# Patient Record
Sex: Female | Born: 1945
Health system: Southern US, Community
[De-identification: ages and names within clinical notes are randomized; demographics above are authoritative.]

## PROBLEM LIST (undated history)

## (undated) DIAGNOSIS — M1990 Unspecified osteoarthritis, unspecified site: Secondary | ICD-10-CM

## (undated) DIAGNOSIS — G4733 Obstructive sleep apnea (adult) (pediatric): Secondary | ICD-10-CM

## (undated) DIAGNOSIS — E559 Vitamin D deficiency, unspecified: Secondary | ICD-10-CM

## (undated) DIAGNOSIS — E7800 Pure hypercholesterolemia, unspecified: Secondary | ICD-10-CM

## (undated) DIAGNOSIS — N289 Disorder of kidney and ureter, unspecified: Secondary | ICD-10-CM

## (undated) DIAGNOSIS — N183 Chronic kidney disease, stage 3 unspecified: Secondary | ICD-10-CM

## (undated) DIAGNOSIS — M549 Dorsalgia, unspecified: Secondary | ICD-10-CM

## (undated) DIAGNOSIS — M2550 Pain in unspecified joint: Secondary | ICD-10-CM

## (undated) DIAGNOSIS — E119 Type 2 diabetes mellitus without complications: Secondary | ICD-10-CM

## (undated) DIAGNOSIS — J302 Other seasonal allergic rhinitis: Secondary | ICD-10-CM

## (undated) DIAGNOSIS — R600 Localized edema: Secondary | ICD-10-CM

## (undated) DIAGNOSIS — R7303 Prediabetes: Secondary | ICD-10-CM

## (undated) HISTORY — PX: BUNIONECTOMY: SHX129

## (undated) HISTORY — DX: Vitamin D deficiency, unspecified: E55.9

## (undated) HISTORY — DX: Disorder of kidney and ureter, unspecified: N28.9

## (undated) HISTORY — DX: Other seasonal allergic rhinitis: J30.2

## (undated) HISTORY — DX: Pain in unspecified joint: M25.50

## (undated) HISTORY — DX: Dorsalgia, unspecified: M54.9

## (undated) HISTORY — DX: Prediabetes: R73.03

## (undated) HISTORY — DX: Pure hypercholesterolemia, unspecified: E78.00

## (undated) HISTORY — PX: APPENDECTOMY: SHX54

## (undated) HISTORY — DX: Unspecified osteoarthritis, unspecified site: M19.90

## (undated) HISTORY — DX: Type 2 diabetes mellitus without complications: E11.9

## (undated) HISTORY — DX: Obstructive sleep apnea (adult) (pediatric): G47.33

## (undated) HISTORY — DX: Morbid (severe) obesity due to excess calories: E66.01

## (undated) HISTORY — DX: Chronic kidney disease, stage 3 unspecified: N18.30

## (undated) HISTORY — DX: Localized edema: R60.0

---

## 1999-07-05 ENCOUNTER — Ambulatory Visit (HOSPITAL_BASED_OUTPATIENT_CLINIC_OR_DEPARTMENT_OTHER): Admission: RE | Admit: 1999-07-05 | Discharge: 1999-07-05 | Payer: Self-pay | Admitting: Orthopedic Surgery

## 1999-08-09 ENCOUNTER — Encounter: Payer: Self-pay | Admitting: *Deleted

## 1999-08-09 ENCOUNTER — Encounter: Admission: RE | Admit: 1999-08-09 | Discharge: 1999-08-09 | Payer: Self-pay | Admitting: *Deleted

## 2000-09-23 ENCOUNTER — Encounter: Payer: Self-pay | Admitting: *Deleted

## 2000-09-23 ENCOUNTER — Encounter: Admission: RE | Admit: 2000-09-23 | Discharge: 2000-09-23 | Payer: Self-pay | Admitting: *Deleted

## 2001-01-26 ENCOUNTER — Other Ambulatory Visit: Admission: RE | Admit: 2001-01-26 | Discharge: 2001-01-26 | Payer: Self-pay | Admitting: *Deleted

## 2001-03-08 ENCOUNTER — Ambulatory Visit (HOSPITAL_COMMUNITY): Admission: RE | Admit: 2001-03-08 | Discharge: 2001-03-08 | Payer: Self-pay | Admitting: Gastroenterology

## 2001-11-11 ENCOUNTER — Encounter: Admission: RE | Admit: 2001-11-11 | Discharge: 2001-11-11 | Payer: Self-pay | Admitting: Family Medicine

## 2001-11-11 ENCOUNTER — Encounter: Payer: Self-pay | Admitting: Family Medicine

## 2002-04-11 ENCOUNTER — Encounter: Payer: Self-pay | Admitting: Family Medicine

## 2002-04-11 ENCOUNTER — Ambulatory Visit (HOSPITAL_COMMUNITY): Admission: RE | Admit: 2002-04-11 | Discharge: 2002-04-11 | Payer: Self-pay | Admitting: Family Medicine

## 2002-10-10 ENCOUNTER — Encounter: Payer: Self-pay | Admitting: Family Medicine

## 2002-10-10 ENCOUNTER — Ambulatory Visit (HOSPITAL_COMMUNITY): Admission: RE | Admit: 2002-10-10 | Discharge: 2002-10-10 | Payer: Self-pay | Admitting: Family Medicine

## 2003-07-25 ENCOUNTER — Encounter: Admission: RE | Admit: 2003-07-25 | Discharge: 2003-07-25 | Payer: Self-pay | Admitting: Family Medicine

## 2004-06-17 ENCOUNTER — Other Ambulatory Visit: Admission: RE | Admit: 2004-06-17 | Discharge: 2004-06-17 | Payer: Self-pay | Admitting: Family Medicine

## 2004-10-29 ENCOUNTER — Encounter: Admission: RE | Admit: 2004-10-29 | Discharge: 2004-10-29 | Payer: Self-pay | Admitting: Family Medicine

## 2005-12-31 ENCOUNTER — Encounter: Admission: RE | Admit: 2005-12-31 | Discharge: 2005-12-31 | Payer: Self-pay | Admitting: Family Medicine

## 2007-02-11 ENCOUNTER — Encounter: Admission: RE | Admit: 2007-02-11 | Discharge: 2007-02-11 | Payer: Self-pay | Admitting: Family Medicine

## 2008-04-20 ENCOUNTER — Other Ambulatory Visit: Admission: RE | Admit: 2008-04-20 | Discharge: 2008-04-20 | Payer: Self-pay | Admitting: Family Medicine

## 2008-04-21 ENCOUNTER — Encounter: Admission: RE | Admit: 2008-04-21 | Discharge: 2008-04-21 | Payer: Self-pay | Admitting: Family Medicine

## 2009-10-24 ENCOUNTER — Encounter: Admission: RE | Admit: 2009-10-24 | Discharge: 2009-10-24 | Payer: Self-pay | Admitting: Family Medicine

## 2009-11-15 ENCOUNTER — Other Ambulatory Visit: Admission: RE | Admit: 2009-11-15 | Discharge: 2009-11-15 | Payer: Self-pay | Admitting: Family Medicine

## 2010-10-04 NOTE — Procedures (Signed)
[place]. [facility]  Patient:    Kelly [name], Kelly [name] Visit Number: [id number] MRN: [id number]          Service Type: END Location: ENDO Attending Physician:  Nelda Marseille Dictated by:   Petra Kuba, M.D. Proc. Date: 03/08/01 Admit Date:  03/08/2001   CC:         Warnell Forester, P.A.   Procedure Report  PROCEDURE:  Colonoscopy.  INDICATION:  Strong family history of colon cancer and colon polyps on both sides of the family, for colonic screening.  Consent was signed after risks, benefits, methods, and options thoroughly discussed in the office.  MEDICATIONS:  Demerol 100 mg, Versed 10 mg.  DESCRIPTION OF PROCEDURE:  Rectal inspection is pertinent for external hemorrhoids.  Digital exam was negative.  The video pediatric colonoscope was inserted, easily advanced around the colon to the cecum.  This did require some abdominal pressure but no position changes.  Cecum was identified by the appendiceal orifice and the ileocecal valve.  In fact, the scope was inserted a short way into the terminal ileum, which was normal.  Photo documentation was obtained, and the scope was slowly withdrawn.  The prep was adequate. There was some liquid stool that required washing and suctioning.  On insertion, no obvious abnormalities were seen.  On slow withdrawal, other than some very early left-sided diverticula, no other abnormalities were seen as we slowly withdrew back to the rectum.  Once back in the rectum the scope was retroflexed, pertinent for some internal hemorrhoids, small.  The scope was then straightened, air was suctioned, scope removed.  The patient tolerated the procedure well.  There was no obvious immediate complication.  ENDOSCOPIC DIAGNOSES: 1. Internal-external hemorrhoids. 2. Very early left-sided diverticula. 3. Otherwise within normal limits to the cecum and terminal ileum.  PLAN:  Yearly rectals and guaiacs per Dr. Tiburcio Pea.  Happy to  see back p.r.n. Otherwise, repeat colonic screening in five years. Dictated by:   Petra Kuba, M.D. Attending Physician:  Nelda Marseille DD:  03/08/01 TD:  03/09/01 Job: [id number]

## 2010-10-04 NOTE — Op Note (Signed)
[place]. [facility]  Patient:    Kelly [name], Kelly [name]                         MRN: [id number] Proc. Date: 07/05/99 Adm. Date:  [id number] Attending:  Susa Day                           Operative Report  PREOPERATIVE DIAGNOSIS:  Chronic mucus cyst-type lesion, dorsal aspect of left ong finger digital interphalangeal joint, ulnar aspect.  POSTOPERATIVE DIAGNOSIS:   Chronic mucus cyst-type lesion, dorsal aspect of left long finger digital interphalangeal joint, ulnar aspect.  OPERATION:  Excisional biopsy of cystic mass, dorsal ulnar aspect of left long finger digital interphalangeal joint.  SURGEON:  Katy Fitch. Sypher, Montez Hageman., M.D.  ASSISTANT:  Jaquelyn Bitter. Chabon, P.A.  ANESTHESIA:  2% lidocaine and 0.25% Marcaine, metacarpal head-level block of left long finger by Dr. Teressa Senter.  DESCRIPTION OF PROCEDURE: Kelly [name] was brought to the minor operating room nd placed in supine position on the operating room table.  Following routine Betadine scrub and paint of the left arm, sterile drapes were applied.  The finger was anesthetized with a metacarpal head-level block of 0.25 Marcaine and 2% lidocaine. When anesthesia was satisfactory, the finger was exsanguinated with a gauze wrap and a 3/4 inch Penrose drain placed at the proximal phalangeal segment as a digital tourniquet.  The procedure commenced with an elliptical incision removing the visible cyst.  Subcutaneous tissues were carefully divided, taking care to remove all remaining cyst membrane.  The DIP joint was entered through an arthrotomy between the extensor tendon and the ulnar collateral ligament.  The capsule was excised, and immediately more mucinous material was uncovered deep to the ulnar collateral ligament.  The deep surface of the distal phalanx at the insertion of the extensor tendon was inspected and found the a small osteophyte forming.  This was removed with a House  curet.  The ulnar aspect of the middle phalanx was likewise debrided.  The joint was thoroughly lavaged with sterile saline.  The wound was then repaired with interrupted sutures of 5-0 nylon.  The finger as dressed with Xeroflo sterile gauze and Coban.  For aftercare, Kelly [name] was given a prescription for Tylenol with codeine No. 3, 1 or 2 tablets p.o. q.4-6h. p.r.n. pain, 20 tablets, 1 refill and Levaquin 500 g 1 p.o. q.d. x 5 days as a prophylactic antibiotic. DD:  07/05/99 TD:  07/06/99 Job: [id number]

## 2011-03-12 ENCOUNTER — Other Ambulatory Visit: Payer: Self-pay | Admitting: Family Medicine

## 2011-03-12 DIAGNOSIS — Z1231 Encounter for screening mammogram for malignant neoplasm of breast: Secondary | ICD-10-CM

## 2011-04-08 ENCOUNTER — Ambulatory Visit
Admission: RE | Admit: 2011-04-08 | Discharge: 2011-04-08 | Disposition: A | Discharge status: Home / Self Care | Payer: Medicare Other | Source: Ambulatory Visit | Attending: Family Medicine | Admitting: Family Medicine

## 2011-04-08 DIAGNOSIS — Z1231 Encounter for screening mammogram for malignant neoplasm of breast: Secondary | ICD-10-CM

## 2013-01-18 ENCOUNTER — Other Ambulatory Visit: Payer: Self-pay

## 2013-01-18 DIAGNOSIS — Z1231 Encounter for screening mammogram for malignant neoplasm of breast: Secondary | ICD-10-CM

## 2013-01-27 ENCOUNTER — Ambulatory Visit
Admission: RE | Admit: 2013-01-27 | Discharge: 2013-01-27 | Disposition: A | Discharge status: Home / Self Care | Payer: Medicare Other | Source: Ambulatory Visit

## 2013-01-27 DIAGNOSIS — Z1231 Encounter for screening mammogram for malignant neoplasm of breast: Secondary | ICD-10-CM

## 2014-01-26 ENCOUNTER — Other Ambulatory Visit: Payer: Self-pay

## 2014-01-26 DIAGNOSIS — Z1231 Encounter for screening mammogram for malignant neoplasm of breast: Secondary | ICD-10-CM

## 2014-02-09 ENCOUNTER — Encounter (INDEPENDENT_AMBULATORY_CARE_PROVIDER_SITE_OTHER): Payer: Self-pay

## 2014-02-09 ENCOUNTER — Ambulatory Visit
Admission: RE | Admit: 2014-02-09 | Discharge: 2014-02-09 | Disposition: A | Discharge status: Home / Self Care | Payer: Medicare Other | Source: Ambulatory Visit

## 2014-02-09 DIAGNOSIS — Z1231 Encounter for screening mammogram for malignant neoplasm of breast: Secondary | ICD-10-CM

## 2015-02-12 ENCOUNTER — Other Ambulatory Visit: Payer: Self-pay

## 2015-02-12 DIAGNOSIS — Z1231 Encounter for screening mammogram for malignant neoplasm of breast: Secondary | ICD-10-CM

## 2015-02-15 ENCOUNTER — Ambulatory Visit
Admission: RE | Admit: 2015-02-15 | Discharge: 2015-02-15 | Disposition: A | Discharge status: Home / Self Care | Payer: Medicare Other | Source: Ambulatory Visit

## 2015-02-15 DIAGNOSIS — Z1231 Encounter for screening mammogram for malignant neoplasm of breast: Secondary | ICD-10-CM

## 2016-04-23 ENCOUNTER — Other Ambulatory Visit: Payer: Self-pay | Admitting: Family Medicine

## 2016-04-23 DIAGNOSIS — Z1231 Encounter for screening mammogram for malignant neoplasm of breast: Secondary | ICD-10-CM

## 2016-06-04 ENCOUNTER — Ambulatory Visit: Payer: Medicare Other

## 2016-06-05 ENCOUNTER — Ambulatory Visit: Payer: Medicare Other

## 2016-06-27 ENCOUNTER — Ambulatory Visit
Admission: RE | Admit: 2016-06-27 | Discharge: 2016-06-27 | Disposition: A | Discharge status: Home / Self Care | Payer: Medicare Other | Source: Ambulatory Visit | Attending: Family Medicine | Admitting: Family Medicine

## 2016-06-27 DIAGNOSIS — Z1231 Encounter for screening mammogram for malignant neoplasm of breast: Secondary | ICD-10-CM

## 2018-03-10 ENCOUNTER — Other Ambulatory Visit: Payer: Self-pay | Admitting: Family Medicine

## 2018-03-10 DIAGNOSIS — Z1231 Encounter for screening mammogram for malignant neoplasm of breast: Secondary | ICD-10-CM

## 2018-04-21 ENCOUNTER — Ambulatory Visit
Admission: RE | Admit: 2018-04-21 | Discharge: 2018-04-21 | Disposition: A | Discharge status: Home / Self Care | Payer: Medicare Other | Source: Ambulatory Visit | Attending: Family Medicine | Admitting: Family Medicine

## 2018-04-21 DIAGNOSIS — Z1231 Encounter for screening mammogram for malignant neoplasm of breast: Secondary | ICD-10-CM

## 2019-03-17 ENCOUNTER — Other Ambulatory Visit: Payer: Self-pay | Admitting: Family Medicine

## 2019-03-17 DIAGNOSIS — Z1231 Encounter for screening mammogram for malignant neoplasm of breast: Secondary | ICD-10-CM

## 2019-03-28 ENCOUNTER — Other Ambulatory Visit: Payer: Self-pay | Admitting: Family Medicine

## 2019-03-28 DIAGNOSIS — R5381 Other malaise: Secondary | ICD-10-CM

## 2019-04-01 ENCOUNTER — Other Ambulatory Visit: Payer: Self-pay | Admitting: Family Medicine

## 2019-04-01 DIAGNOSIS — M85851 Other specified disorders of bone density and structure, right thigh: Secondary | ICD-10-CM

## 2019-04-11 LAB — LIPID PANEL
Cholesterol: 166 (ref 0–200)
HDL: 71 — AB (ref 35–70)
LDl/HDL Ratio: 2.3
Triglycerides: 132 (ref 40–160)

## 2019-04-11 LAB — VITAMIN D 25 HYDROXY (VIT D DEFICIENCY, FRACTURES): Vit D, 25-Hydroxy: 50.4

## 2019-04-11 LAB — TSH: TSH: 1.34 (ref 0.41–5.90)

## 2019-05-10 ENCOUNTER — Ambulatory Visit
Admission: RE | Admit: 2019-05-10 | Discharge: 2019-05-10 | Disposition: A | Discharge status: Home / Self Care | Payer: Medicare Other | Source: Ambulatory Visit | Attending: Family Medicine | Admitting: Family Medicine

## 2019-05-10 ENCOUNTER — Other Ambulatory Visit: Payer: Self-pay

## 2019-05-10 DIAGNOSIS — Z1231 Encounter for screening mammogram for malignant neoplasm of breast: Secondary | ICD-10-CM

## 2019-06-16 ENCOUNTER — Other Ambulatory Visit: Payer: Self-pay

## 2019-06-16 ENCOUNTER — Ambulatory Visit
Admission: RE | Admit: 2019-06-16 | Discharge: 2019-06-16 | Disposition: A | Discharge status: Home / Self Care | Payer: Medicare Other | Source: Ambulatory Visit | Attending: Family Medicine | Admitting: Family Medicine

## 2019-06-16 DIAGNOSIS — M85851 Other specified disorders of bone density and structure, right thigh: Secondary | ICD-10-CM

## 2019-11-15 LAB — HEPATIC FUNCTION PANEL
ALT: 35 (ref 7–35)
AST: 23 (ref 13–35)
Alkaline Phosphatase: 54 (ref 25–125)
Bilirubin, Total: 0.6

## 2019-11-15 LAB — COMPREHENSIVE METABOLIC PANEL
Albumin: 4.5 (ref 3.5–5.0)
GFR calc Af Amer: 55
GFR calc non Af Amer: 46

## 2019-11-15 LAB — BASIC METABOLIC PANEL
BUN: 16 (ref 4–21)
Chloride: 101 (ref 99–108)
Creatinine: 1.2 — AB (ref 0.5–1.1)
Glucose: 126
Potassium: 4.4 (ref 3.4–5.3)
Sodium: 140 (ref 137–147)

## 2019-11-15 LAB — HEMOGLOBIN A1C: Hemoglobin A1C: 7.7

## 2019-12-08 ENCOUNTER — Other Ambulatory Visit: Payer: Self-pay

## 2019-12-08 ENCOUNTER — Encounter (INDEPENDENT_AMBULATORY_CARE_PROVIDER_SITE_OTHER): Payer: Self-pay | Admitting: Family Medicine

## 2019-12-08 ENCOUNTER — Ambulatory Visit (INDEPENDENT_AMBULATORY_CARE_PROVIDER_SITE_OTHER): Payer: Medicare Other | Admitting: Family Medicine

## 2019-12-08 VITALS — BP 120/74 | HR 70 | Temp 98.2°F | Ht 64.0 in | Wt 223.0 lb

## 2019-12-08 DIAGNOSIS — E1169 Type 2 diabetes mellitus with other specified complication: Secondary | ICD-10-CM

## 2019-12-08 DIAGNOSIS — Z0289 Encounter for other administrative examinations: Secondary | ICD-10-CM

## 2019-12-08 DIAGNOSIS — G4733 Obstructive sleep apnea (adult) (pediatric): Secondary | ICD-10-CM | POA: Diagnosis not present

## 2019-12-08 DIAGNOSIS — Z1331 Encounter for screening for depression: Secondary | ICD-10-CM

## 2019-12-08 DIAGNOSIS — R0602 Shortness of breath: Secondary | ICD-10-CM | POA: Diagnosis not present

## 2019-12-08 DIAGNOSIS — R5383 Other fatigue: Secondary | ICD-10-CM | POA: Diagnosis not present

## 2019-12-08 DIAGNOSIS — Z6838 Body mass index (BMI) 38.0-38.9, adult: Secondary | ICD-10-CM

## 2019-12-08 DIAGNOSIS — N183 Chronic kidney disease, stage 3 unspecified: Secondary | ICD-10-CM

## 2019-12-08 DIAGNOSIS — Z9989 Dependence on other enabling machines and devices: Secondary | ICD-10-CM

## 2019-12-08 DIAGNOSIS — R600 Localized edema: Secondary | ICD-10-CM

## 2019-12-08 DIAGNOSIS — E1122 Type 2 diabetes mellitus with diabetic chronic kidney disease: Secondary | ICD-10-CM

## 2019-12-08 DIAGNOSIS — E559 Vitamin D deficiency, unspecified: Secondary | ICD-10-CM

## 2019-12-08 DIAGNOSIS — E785 Hyperlipidemia, unspecified: Secondary | ICD-10-CM

## 2019-12-08 NOTE — Progress Notes (Signed)
Chief Complaint:   OBESITY Kelly [name] (MR# [id number]) is [name] 74 y.o. female who presents for evaluation and treatment of obesity and related comorbidities. Current BMI is Body mass index is 38.28 kg/m. Kelly [name] has been struggling with her weight for many years and has been unsuccessful in either losing weight, maintaining weight loss, or reaching her healthy weight goal.  Kelly [name] is currently in the action stage of change and ready to dedicate time achieving and maintaining [name] healthier weight. Kelly [name] is interested in becoming our patient and working on intensive lifestyle modifications including (but not limited to) diet and exercise for weight loss.  Kelly [name]'s habits were reviewed today and are as follows: Her family eats meals together, she thinks her family will eat healthier with her, her desired weight loss is 52 pounds, she started gaining weight after menopause, her heaviest weight ever was 240 pounds, she craves carbs - break, potatoes, crackers, she snacks frequently in the evenings, she is frequently drinking liquids with calories and she frequently eats larger portions than normal.  Depression Screen Kelly [name]'s Food and Mood (modified PHQ-9) score was 9.  Depression screen Kelly [name] 2/9 12/08/2019  Decreased Interest 2  Down, Depressed, Hopeless 1  PHQ - 2 Score 3  Altered sleeping 1  Tired, decreased energy 3  Change in appetite 1  Feeling bad or failure about yourself  1  Trouble concentrating 0  Moving slowly or fidgety/restless 0  Suicidal thoughts 0  PHQ-9 Score 9  Difficult doing work/chores Somewhat difficult   Subjective:   1. Other fatigue Kelly [name] reports daytime somnolence and denies waking up still tired. Patent has [name] history of symptoms of daytime fatigue and snoring when not using CPAP. Kelly [name] generally gets 8 hours of sleep per night, and states that she has generally restful sleep. Snoring is present. Apneic episodes are not present. Kelly [name] uses [name] CPAP machine.  Epworth  Sleepiness Score is 6.  2. SOB (shortness of breath) on exertion Kelly [name] notes increasing shortness of breath with exercising and seems to be worsening over time with weight gain. She notes getting out of breath sooner with activity than she used to. This has gotten worse recently. Kelly [name] denies shortness of breath at rest or orthopnea.  3. Type 2 diabetes mellitus with stage 3 chronic kidney disease, without long-term current use of insulin, unspecified whether stage 3a or 3b CKD (HCC) Medications reviewed. Diabetic ROS: no polyuria or polydipsia, no chest pain, dyspnea or TIA's, no numbness, tingling or pain in extremities.  She is taking metformin.  Checks blood sugar at home.  Fasting ranges from 120s-150s.  A1c on 6/30 was 7.7.  4. OSA on CPAP Kelly [name] has [name] diagnosis of sleep apnea. She reports that she is using [name] CPAP regularly. She has used [name] CPAP for 4 years.  5. Hyperlipidemia associated with type 2 diabetes mellitus (HCC) Kelly [name] has hyperlipidemia and has been trying to improve her cholesterol levels with intensive lifestyle modification including [name] low saturated fat diet, exercise and weight loss. She denies any chest pain, claudication or myalgias.  She is taking atorvastatin once [name] week.  6. Vitamin D deficiency She is currently taking OTC vitamin D 2000 IU each day. She denies nausea, vomiting or muscle weakness.  7. Lower extremity edema Kelly [name] has swelling in her left ankle.  She is taking Maxzide.  8. Encounter for screening for depression Kelly [name] was screened for depression as part of her new patient workup.  PHQ-9 is 9.  Assessment/Plan:   1. Other fatigue Kelly [name] does feel that her weight is causing her energy to be lower than it should be. Fatigue may be related to obesity, depression or many other causes. Labs will be ordered, and in the meanwhile, Kelly [name] will focus on self care including making healthy food choices, increasing physical activity and focusing on stress  reduction.  - EKG 12-Lead  2. SOB (shortness of breath) on exertion Kelly [name] does feel that she gets out of breath more easily that she used to when she exercises. Kelly [name]'s shortness of breath appears to be obesity related and exercise induced. She has agreed to work on weight loss and gradually increase exercise to treat her exercise induced shortness of breath. Will continue to monitor closely.  3. Type 2 diabetes mellitus with stage 3 chronic kidney disease, without long-term current use of insulin, unspecified whether stage 3a or 3b CKD (HCC) Good blood sugar control is important to decrease the likelihood of diabetic complications such as nephropathy, neuropathy, limb loss, blindness, coronary artery disease, and death. Intensive lifestyle modification including diet, exercise and weight loss are the first line of treatment for diabetes.   4. OSA on CPAP Intensive lifestyle modifications are the first line treatment for this issue. We discussed several lifestyle modifications today and she will continue to work on diet, exercise and weight loss efforts. We will continue to monitor. Orders and follow up as documented in patient record.   Counseling  Sleep apnea is [name] condition in which breathing pauses or becomes shallow during sleep. This happens over and over during the night. This disrupts your sleep and keeps your body from getting the rest that it needs, which can cause tiredness and lack of energy (fatigue) during the day.  Sleep apnea treatment: If you were given [name] device to open your airway while you sleep, USE IT!  Sleep hygiene:   Limit or avoid alcohol, caffeinated beverages, and cigarettes, especially close to bedtime.   Do not eat [name] large meal or eat spicy foods right before bedtime. This can lead to digestive discomfort that can make it hard for you to sleep.  Keep [name] sleep diary to help you and your health care provider figure out what could be causing your insomnia.  . Make  your bedroom [name] dark, comfortable place where it is easy to fall asleep. ? Put up shades or blackout curtains to block light from outside. ? Use [name] white noise machine to block noise. ? Keep the temperature cool. . Limit screen use before bedtime. This includes: ? Watching TV. ? Using your smartphone, tablet, or computer. . Stick to [name] routine that includes going to bed and waking up at the same times every day and night. This can help you fall asleep faster. Consider making [name] quiet activity, such as reading, part of your nighttime routine. . Try to avoid taking naps during the day so that you sleep better at night. . Get out of bed if you are still awake after 15 minutes of trying to sleep. Keep the lights down, but try reading or doing [name] quiet activity. When you feel sleepy, go back to bed.  5. Hyperlipidemia associated with type 2 diabetes mellitus (HCC) Cardiovascular risk and specific lipid/LDL goals reviewed.  We discussed several lifestyle modifications today and Kelly [name] will continue to work on diet, exercise and weight loss efforts. Orders and follow up as documented in patient record.   Counseling Intensive lifestyle modifications are the first line treatment for  this issue. . Dietary changes: Increase soluble fiber. Decrease simple carbohydrates. . Exercise changes: Moderate to vigorous-intensity aerobic activity 150 minutes per week if tolerated. . Lipid-lowering medications: see documented in medical record.  6. Vitamin D deficiency Low Vitamin D level contributes to fatigue and are associated with obesity, breast, and colon cancer. She agrees to continue to take OTC Vitamin D @2 ,000 IU daily and will follow-up for routine testing of Vitamin D, at least 2-3 times per year to avoid over-replacement.  7. Lower extremity edema Current treatment plan is effective, no change in therapy. Orders and follow up as documented in patient record.  8. Encounter for screening for  depression Kelly [name] had [name] positive depression screening. Depression is commonly associated with obesity and often results in emotional eating behaviors. We will monitor this closely and work on CBT to help improve the non-hunger eating patterns. Referral to Psychology may be required if no improvement is seen as she continues in our clinic.  9. Class 2 severe obesity with serious comorbidity and body mass index (BMI) of 38.0 to 38.9 in adult, unspecified obesity type Kelly [name]) Kelly [name] is currently in the action stage of change and her goal is to continue with weight loss efforts. I recommend Kelly [name] begin the structured treatment plan as follows:  She has agreed to the Category 1 Plan.  Exercise goals: No exercise has been prescribed at this time.   Behavioral modification strategies: increasing lean protein intake, decreasing simple carbohydrates, increasing vegetables, increasing water intake, decreasing liquid calories, decreasing sodium intake and increasing high fiber foods.  She was informed of the importance of frequent follow-up visits to maximize her success with intensive lifestyle modifications for her multiple health conditions. She was informed we would discuss her lab results at her next visit unless there is [name] critical issue that needs to be addressed sooner. Kelly [name] agreed to keep her next visit at the agreed upon time to discuss these results.  Objective:   Blood pressure 120/74, pulse 70, temperature 98.2 F (36.8 C), temperature source Oral, height 5\' 4"  (1.626 m), weight 223 lb (101.2 kg), SpO2 99 %. Body mass index is 38.28 kg/m.  EKG: Normal sinus rhythm, rate 66 bpm.  Indirect Calorimeter completed today shows [name] VO2 of 183 and [name] REE of 1270.  Her calculated basal metabolic rate is Kelly [name] thus her basal metabolic rate is worse than expected.  General: Cooperative, alert, well developed, in no acute distress. HEENT: Conjunctivae and lids unremarkable. Cardiovascular: Regular rhythm.   Lungs: Normal work of breathing. Neurologic: No focal deficits.   Attestation Statements:   This is the patient's first visit at [facility]. The patient's NEW PATIENT PACKET was reviewed at length. Included in the packet: current and past health history, medications, allergies, ROS, gynecologic history (women only), surgical history, family history, social history, weight history, weight loss surgery history (for those that have had weight loss surgery), nutritional evaluation, mood and food questionnaire, PHQ9, Epworth questionnaire, sleep habits questionnaire, patient life and health improvement goals questionnaire. These will all be scanned into the patient's chart under media.   During the visit, I independently reviewed the patient's EKG, bioimpedance scale results, and indirect calorimeter results. I used this information to tailor [name] meal plan for the patient that will help her to lose weight and will improve her obesity-related conditions going forward. I performed [name] medically necessary appropriate examination and/or evaluation. I discussed the assessment and treatment plan with the patient. The patient was provided an opportunity  to ask questions and all were answered. The patient agreed with the plan and demonstrated an understanding of the instructions. Labs were ordered at this visit and will be reviewed at the next visit unless more critical results need to be addressed immediately. Clinical information was updated and documented in the EMR.   Time spent on visit including pre-visit chart review and post-visit charting and care was 62 minutes.   I, Insurance claims handler, CMA, am acting as transcriptionist for Helane Rima, DO  I have reviewed the above documentation for accuracy and completeness, and I agree with the above. Helane Rima, DO

## 2019-12-08 NOTE — Telephone Encounter (Signed)
Please advise 

## 2019-12-22 ENCOUNTER — Ambulatory Visit (INDEPENDENT_AMBULATORY_CARE_PROVIDER_SITE_OTHER): Payer: Medicare Other | Admitting: Family Medicine

## 2019-12-22 ENCOUNTER — Other Ambulatory Visit: Payer: Self-pay

## 2019-12-22 ENCOUNTER — Encounter (INDEPENDENT_AMBULATORY_CARE_PROVIDER_SITE_OTHER): Payer: Self-pay | Admitting: Family Medicine

## 2019-12-22 VITALS — BP 123/76 | HR 69 | Temp 98.2°F | Ht 64.0 in | Wt 218.0 lb

## 2019-12-22 DIAGNOSIS — Z9989 Dependence on other enabling machines and devices: Secondary | ICD-10-CM

## 2019-12-22 DIAGNOSIS — E1159 Type 2 diabetes mellitus with other circulatory complications: Secondary | ICD-10-CM | POA: Diagnosis not present

## 2019-12-22 DIAGNOSIS — G4733 Obstructive sleep apnea (adult) (pediatric): Secondary | ICD-10-CM

## 2019-12-22 DIAGNOSIS — I10 Essential (primary) hypertension: Secondary | ICD-10-CM

## 2019-12-22 DIAGNOSIS — N1831 Chronic kidney disease, stage 3a: Secondary | ICD-10-CM

## 2019-12-22 DIAGNOSIS — E1121 Type 2 diabetes mellitus with diabetic nephropathy: Secondary | ICD-10-CM

## 2019-12-22 DIAGNOSIS — Z6837 Body mass index (BMI) 37.0-37.9, adult: Secondary | ICD-10-CM

## 2019-12-22 DIAGNOSIS — E1122 Type 2 diabetes mellitus with diabetic chronic kidney disease: Secondary | ICD-10-CM

## 2019-12-22 DIAGNOSIS — I152 Hypertension secondary to endocrine disorders: Secondary | ICD-10-CM

## 2019-12-26 NOTE — Progress Notes (Signed)
Chief Complaint:   OBESITY Kelly [name] is here to discuss her progress with her obesity treatment plan along with follow-up of her obesity related diagnoses. Kaisha is on the Category 1 Plan and states she is following her eating plan approximately 99% of the time. Thania states she is exercising for 0 minutes 0 times per week.  Today's visit was #: 2 Starting weight: 223 lbs Starting date: 12/08/2019 Today's weight: 218 lbs Today's date: 12/22/2019 Total lbs lost to date: 5 lbs Total lbs lost since last in-office visit: 5 lbs  Interim History: Gayl says she is happy with the plan and her progress.  Denies polyphagia.  Fasting blood glucose ranges from 120s-156.  Subjective:   1. Type 2 diabetes mellitus with stage 3a chronic kidney disease, without long-term current use of insulin (HCC) Medications reviewed. Diabetic ROS: no polyuria or polydipsia, no chest pain, dyspnea or TIA's, no numbness, tingling or pain in extremities.   Lab Results  Component Value Date   HGBA1C 7.7 11/15/2019   Lab Results  Component Value Date   CREATININE 1.2 (A) 11/15/2019   2. OSA on CPAP [place]ine has a diagnosis of sleep apnea. She reports that she is using a CPAP regularly.   3. Hypertension associated with diabetes (HCC) Review: taking medications as instructed, no medication side effects noted, no chest pain on exertion, no dyspnea on exertion, no swelling of ankles.   BP Readings from Last 3 Encounters:  12/22/19 123/76  12/08/19 120/74   Assessment/Plan:   1. Type 2 diabetes mellitus with stage 3a chronic kidney disease, without long-term current use of insulin (HCC) Not optimized. Good blood sugar control is important to decrease the likelihood of diabetic complications such as nephropathy, neuropathy, limb loss, blindness, coronary artery disease, and death. Intensive lifestyle modification including diet, exercise and weight loss are the first line of treatment for diabetes.   2. OSA on  CPAP The current medical regimen is effective;  continue present plan and medications.  3. Hypertension associated with diabetes (HCC) At goal. Eudelia is working on healthy weight loss and exercise to improve blood pressure control. We will watch for signs of hypotension as she continues her lifestyle modifications.  4. Class 2 severe obesity with serious comorbidity and body mass index (BMI) of 37.0 to 37.9 in adult, unspecified obesity type [facility]) Fronia is currently in the action stage of change. As such, her goal is to continue with weight loss efforts. She has agreed to the Category 1 Plan.   Exercise goals: No exercise has been prescribed at this time.  Behavioral modification strategies: increasing lean protein intake, increasing water intake and increasing high fiber foods.  Therasa has agreed to follow-up with our clinic in 2 weeks. She was informed of the importance of frequent follow-up visits to maximize her success with intensive lifestyle modifications for her multiple health conditions.   Objective:   Blood pressure 123/76, pulse 69, temperature 98.2 F (36.8 C), temperature source Oral, height 5\' 4"  (1.626 m), weight 218 lb (98.9 kg), SpO2 97 %. Body mass index is 37.42 kg/m.  General: Cooperative, alert, well developed, in no acute distress. HEENT: Conjunctivae and lids unremarkable. Cardiovascular: Regular rhythm.  Lungs: Normal work of breathing. Neurologic: No focal deficits.   Attestation Statements:   Reviewed by clinician on day of visit: allergies, medications, problem list, medical history, surgical history, family history, social history, and previous encounter notes.  Time spent on visit including pre-visit chart review and post-visit care  and charting was 25 minutes.   I, Insurance claims handler, CMA, am acting as transcriptionist for Helane Rima, DO  I have reviewed the above documentation for accuracy and completeness, and I agree with the above. Helane Rima,  DO

## 2019-12-27 ENCOUNTER — Other Ambulatory Visit (INDEPENDENT_AMBULATORY_CARE_PROVIDER_SITE_OTHER): Payer: Self-pay | Admitting: Family Medicine

## 2020-01-05 ENCOUNTER — Other Ambulatory Visit: Payer: Self-pay

## 2020-01-05 ENCOUNTER — Encounter (INDEPENDENT_AMBULATORY_CARE_PROVIDER_SITE_OTHER): Payer: Self-pay | Admitting: Physician Assistant

## 2020-01-05 ENCOUNTER — Ambulatory Visit (INDEPENDENT_AMBULATORY_CARE_PROVIDER_SITE_OTHER): Payer: Medicare Other | Admitting: Physician Assistant

## 2020-01-05 VITALS — BP 135/81 | HR 73 | Temp 98.2°F | Ht 64.0 in | Wt 216.0 lb

## 2020-01-05 DIAGNOSIS — I10 Essential (primary) hypertension: Secondary | ICD-10-CM

## 2020-01-05 DIAGNOSIS — N1831 Chronic kidney disease, stage 3a: Secondary | ICD-10-CM | POA: Diagnosis not present

## 2020-01-05 DIAGNOSIS — E1121 Type 2 diabetes mellitus with diabetic nephropathy: Secondary | ICD-10-CM | POA: Diagnosis not present

## 2020-01-05 DIAGNOSIS — Z6837 Body mass index (BMI) 37.0-37.9, adult: Secondary | ICD-10-CM | POA: Diagnosis not present

## 2020-01-05 DIAGNOSIS — E1122 Type 2 diabetes mellitus with diabetic chronic kidney disease: Secondary | ICD-10-CM

## 2020-01-09 NOTE — Progress Notes (Signed)
Chief Complaint:   OBESITY Kelly [name] is here to discuss her progress with her obesity treatment plan along with follow-up of her obesity related diagnoses. Kelly [name] is on the Category 1 Plan and states she is following her eating plan approximately 95% of the time. Kelly [name] states she is exercising 0 minutes 0 times per week.  Today's visit was #: 3 Starting weight: 223 lbs Starting date: 12/08/2019 Today's weight: 216 lbs Today's date: 01/05/2020 Total lbs lost to date: 7 Total lbs lost since last in-office visit: 2  Interim History: Kelly [name] states that she is enjoying her meal plan, but that dinner protein can be a challenge. She is looking for more variety with her dinner meal.  Subjective:   Type 2 diabetes mellitus with stage 3a chronic kidney disease, without long-term current use of insulin (HCC). Kelly [name] is on metformin twice daily. No hypoglycemia. Fasting blood sugars are in the range of 108 and 135. She is checking into a GLP-1 with her insurance.  Lab Results  Component Value Date   HGBA1C 7.7 11/15/2019   Lab Results  Component Value Date   CREATININE 1.2 (A) 11/15/2019   No results found for: INSULIN  Essential hypertension. Blood pressure is controlled on Maxzide. No  chest pain or headache.   BP Readings from Last 3 Encounters:  01/05/20 135/81  12/22/19 123/76  12/08/19 120/74   Lab Results  Component Value Date   CREATININE 1.2 (A) 11/15/2019   Assessment/Plan:   Type 2 diabetes mellitus with stage 3a chronic kidney disease, without long-term current use of insulin (HCC). Good blood sugar control is important to decrease the likelihood of diabetic complications such as nephropathy, neuropathy, limb loss, blindness, coronary artery disease, and death. Intensive lifestyle modification including diet, exercise and weight loss are the first line of treatment for diabetes. Kelly [name] will continue metformin as directed. She will check on GLP-1 with Kelly [name].  Essential hypertension. Kelly [name] is working on healthy weight loss and exercise to improve blood pressure control. We will watch for signs of hypotension as she continues her lifestyle modifications. She will continue her medication as directed.   Class 2 severe obesity with serious comorbidity and body mass index (BMI) of 37.0 to 37.9 in adult, unspecified obesity type (HCC).  Kelly [name] is currently in the action stage of change. As such, her goal is to continue with weight loss efforts. She has agreed to the Category 1 Plan and will journal 300-400 calories and 35 grams of protein at supper.   Exercise goals: Older adults should follow the adult guidelines. When older adults cannot meet the adult guidelines, they should be as physically active as their abilities and conditions will allow.   Behavioral modification strategies: increasing lean protein intake and meal planning and cooking strategies.  Kelly [name] has agreed to follow-up with our clinic in 2 weeks. She was informed of the importance of frequent follow-up visits to maximize her success with intensive lifestyle modifications for her multiple health conditions.   Objective:   Blood pressure 135/81, pulse 73, temperature 98.2 F (36.8 C), temperature source Oral, height 5\' 4"  (1.626 m), weight 216 lb (98 kg), SpO2 97 %. Body mass index is 37.08 kg/m.  General: Cooperative, alert, well developed, in no acute distress. HEENT: Conjunctivae and lids unremarkable. Cardiovascular: Regular rhythm.  Lungs: Normal work of breathing. Neurologic: No focal deficits.   Lab Results  Component Value Date   CREATININE 1.2 (A) 11/15/2019   BUN 16 11/15/2019  NA 140 11/15/2019   K 4.4 11/15/2019   CL 101 11/15/2019   Lab Results  Component Value Date   ALT 35 11/15/2019   AST 23 11/15/2019   ALKPHOS 54 11/15/2019   Lab Results  Component Value Date   HGBA1C 7.7 11/15/2019   No results found for: INSULIN Lab Results  Component  Value Date   TSH 1.34 04/11/2019   Lab Results  Component Value Date   CHOL 166 04/11/2019   HDL 71 (A) 04/11/2019   TRIG 132 04/11/2019   No results found for: WBC, HGB, HCT, MCV, PLT No results found for: IRON, TIBC, FERRITIN  Obesity Behavioral Intervention Documentation for Insurance:   Approximately 15 minutes were spent on the discussion below.  ASK: We discussed the diagnosis of obesity with Kelly [name] today and Kelly [name] agreed to give [place] permission to discuss obesity behavioral modification therapy today.  ASSESS: Kelly [name] has the diagnosis of obesity and her BMI today is 37.1. Kelly [name] is in the action stage of change.   ADVISE: Kelly [name] was educated on the multiple health risks of obesity as well as the benefit of weight loss to improve her health. She was advised of the need for long term treatment and the importance of lifestyle modifications to improve her current health and to decrease her risk of future health problems.  AGREE: Multiple dietary modification options and treatment options were discussed and Kelly [name] agreed to follow the recommendations documented in the above note.  ARRANGE: Kelly [name] was educated on the importance of frequent visits to treat obesity as outlined per CMS and USPSTF guidelines and agreed to schedule her next follow up appointment today.  Attestation Statements:   Reviewed by clinician on day of visit: allergies, medications, problem list, medical history, surgical history, family history, social history, and previous encounter notes.  Kelly [name], am acting as transcriptionist for Alois Cliche, PA-C   I have reviewed the above documentation for accuracy and completeness, and I agree with the above. Alois Cliche, PA-C

## 2020-01-19 ENCOUNTER — Ambulatory Visit (INDEPENDENT_AMBULATORY_CARE_PROVIDER_SITE_OTHER): Payer: Medicare Other | Admitting: Physician Assistant

## 2020-02-01 ENCOUNTER — Encounter (INDEPENDENT_AMBULATORY_CARE_PROVIDER_SITE_OTHER): Payer: Self-pay | Admitting: Family Medicine

## 2020-02-01 ENCOUNTER — Ambulatory Visit (INDEPENDENT_AMBULATORY_CARE_PROVIDER_SITE_OTHER): Payer: Medicare Other | Admitting: Family Medicine

## 2020-02-01 ENCOUNTER — Other Ambulatory Visit: Payer: Self-pay

## 2020-02-01 VITALS — BP 132/70 | HR 65 | Temp 98.1°F | Ht 64.0 in | Wt 208.0 lb

## 2020-02-01 DIAGNOSIS — G4733 Obstructive sleep apnea (adult) (pediatric): Secondary | ICD-10-CM | POA: Diagnosis not present

## 2020-02-01 DIAGNOSIS — I152 Hypertension secondary to endocrine disorders: Secondary | ICD-10-CM

## 2020-02-01 DIAGNOSIS — E1121 Type 2 diabetes mellitus with diabetic nephropathy: Secondary | ICD-10-CM

## 2020-02-01 DIAGNOSIS — I10 Essential (primary) hypertension: Secondary | ICD-10-CM

## 2020-02-01 DIAGNOSIS — Z6835 Body mass index (BMI) 35.0-35.9, adult: Secondary | ICD-10-CM | POA: Diagnosis not present

## 2020-02-01 DIAGNOSIS — Z9989 Dependence on other enabling machines and devices: Secondary | ICD-10-CM

## 2020-02-01 DIAGNOSIS — E1159 Type 2 diabetes mellitus with other circulatory complications: Secondary | ICD-10-CM | POA: Diagnosis not present

## 2020-02-01 DIAGNOSIS — N1831 Chronic kidney disease, stage 3a: Secondary | ICD-10-CM

## 2020-02-01 DIAGNOSIS — E1122 Type 2 diabetes mellitus with diabetic chronic kidney disease: Secondary | ICD-10-CM

## 2020-02-05 NOTE — Progress Notes (Signed)
Chief Complaint:   OBESITY Kelly [name] is here to discuss her progress with her obesity treatment plan along with follow-up of her obesity related diagnoses. Kelly [name] is on the Category 1 Plan and states she is following her eating plan approximately 90% of the time. Kelly [name] states she is walking for 15 minutes 7 times per week.  Today's visit was #: 4 Starting weight: 223 lbs Starting date: 12/08/2019 Today's weight: 208 lbs Today's date: 02/01/2020 Total lbs lost to date: 15 lbs Total lbs lost since last in-office visit: 8 lbs Total weight loss percentage to date: -6.73%  Interim History: Kelly [name] says she is drinking 24 ounces of water (at least 3 bottles) per day.  Challenges:  She is hungry at night.  Assessment/Plan:   1. Hypertension associated with diabetes (HCC) Generally at goal. Kelly [name] is working on healthy weight loss and exercise to improve blood pressure control.   BP Readings from Last 3 Encounters:  02/01/20 132/70  01/05/20 135/81  12/22/19 123/76   2. Type 2 diabetes mellitus with stage 3a chronic kidney disease, without long-term current use of insulin (HCC) Diabetes Mellitus: needs improvement Issues reviewed with her: blood sugar goals, complications of diabetes mellitus, hypoglycemia prevention and treatment, exercise, nutrition, and carbohydrate counting.   Lab Results  Component Value Date   HGBA1C 7.7 11/15/2019   Lab Results  Component Value Date   CREATININE 1.2 (A) 11/15/2019   3. OSA on CPAP Compliant. Encouraged continued use. OSA is a cause of systemic hypertension and is associated with an increased incidence of stroke, heart failure, atrial fibrillation, and coronary heart disease. Severe OSA increases all-cause mortality and  cardiovascular mortality.   Goal: Treatment of OSA via CPAP compliance and weight loss. . Plasma ghrelin levels (appetite or "hunger hormone") are significantly higher in OSA patients than in BMI-matched controls, but decrease to  levels similar to those of obese patients without OSA after CPAP treatment.  . Weight loss improves OSA by several mechanisms, including reduction in fatty tissue in the throat (i.e. parapharyngeal fat) and the tongue. Loss of abdominal fat increases mediastinal traction on the upper airway making it less likely to collapse during sleep. . Studies have also shown that compliance with CPAP treatment improves leptin (hunger inhibitory hormone) imbalance.  4. Class 2 severe obesity with serious comorbidity and body mass index (BMI) of 35.0 to 35.9 in adult, unspecified obesity type Kelly [name]) Kelly [name] is currently in the action stage of change. As such, her goal is to continue with weight loss efforts. She has agreed to the Category 1 Plan with 300-400 calories and 35 grams of protein at dinner.   Exercise goals: As is.  Plus okay to start recumbent bike.  Reviewed Theraband exercises.  Behavioral modification strategies: increasing lean protein intake and ways to avoid night time snacking.  Kelly [name] has agreed to follow-up with our clinic in 2-3 weeks. She was informed of the importance of frequent follow-up visits to maximize her success with intensive lifestyle modifications for her multiple health conditions.   Objective:   Blood pressure 132/70, pulse 65, temperature 98.1 F (36.7 C), temperature source Oral, height 5\' 4"  (1.626 m), weight 208 lb (94.3 kg), SpO2 98 %. Body mass index is 35.7 kg/m.  General: Cooperative, alert, well developed, in no acute distress. HEENT: Conjunctivae and lids unremarkable. Cardiovascular: Regular rhythm.  Lungs: Normal work of breathing. Neurologic: No focal deficits.   Lab Results  Component Value Date   CREATININE 1.2 (A) 11/15/2019  BUN 16 11/15/2019   NA 140 11/15/2019   K 4.4 11/15/2019   CL 101 11/15/2019   Lab Results  Component Value Date   ALT 35 11/15/2019   AST 23 11/15/2019   ALKPHOS 54 11/15/2019   Lab Results  Component Value Date    HGBA1C 7.7 11/15/2019   Lab Results  Component Value Date   TSH 1.34 04/11/2019   Lab Results  Component Value Date   CHOL 166 04/11/2019   HDL 71 (A) 04/11/2019   TRIG 132 04/11/2019   Obesity Behavioral Intervention:   Approximately 15 minutes were spent on the discussion below.  ASK: We discussed the diagnosis of obesity with Kelly [name] today and Kelly [name] agreed to give [place] permission to discuss obesity behavioral modification therapy today.  ASSESS: Kelly [name] has the diagnosis of obesity and her BMI today is 35.7. Kelly [name] is in the action stage of change.   ADVISE: Kelly [name] was educated on the multiple health risks of obesity as well as the benefit of weight loss to improve her health. She was advised of the need for long term treatment and the importance of lifestyle modifications to improve her current health and to decrease her risk of future health problems.  AGREE: Multiple dietary modification options and treatment options were discussed and Kelly [name] agreed to follow the recommendations documented in the above note.  ARRANGE: Kelly [name] was educated on the importance of frequent visits to treat obesity as outlined per CMS and USPSTF guidelines and agreed to schedule her next follow up appointment today.  Attestation Statements:   Reviewed by clinician on day of visit: allergies, medications, problem list, medical history, surgical history, family history, social history, and previous encounter notes.  I, Insurance claims handler, CMA, am acting as transcriptionist for Helane Rima, DO  I have reviewed the above documentation for accuracy and completeness, and I agree with the above. Helane Rima, DO

## 2020-02-15 ENCOUNTER — Ambulatory Visit (INDEPENDENT_AMBULATORY_CARE_PROVIDER_SITE_OTHER): Payer: Medicare Other | Admitting: Physician Assistant

## 2020-02-15 ENCOUNTER — Encounter (INDEPENDENT_AMBULATORY_CARE_PROVIDER_SITE_OTHER): Payer: Self-pay | Admitting: Physician Assistant

## 2020-02-15 ENCOUNTER — Other Ambulatory Visit: Payer: Self-pay

## 2020-02-15 VITALS — BP 113/69 | HR 72 | Temp 97.8°F | Ht 64.0 in | Wt 206.0 lb

## 2020-02-15 DIAGNOSIS — E785 Hyperlipidemia, unspecified: Secondary | ICD-10-CM | POA: Diagnosis not present

## 2020-02-15 DIAGNOSIS — Z6835 Body mass index (BMI) 35.0-35.9, adult: Secondary | ICD-10-CM | POA: Diagnosis not present

## 2020-02-15 DIAGNOSIS — E1169 Type 2 diabetes mellitus with other specified complication: Secondary | ICD-10-CM | POA: Diagnosis not present

## 2020-02-15 DIAGNOSIS — N183 Chronic kidney disease, stage 3 unspecified: Secondary | ICD-10-CM | POA: Diagnosis not present

## 2020-02-15 DIAGNOSIS — E1122 Type 2 diabetes mellitus with diabetic chronic kidney disease: Secondary | ICD-10-CM | POA: Diagnosis not present

## 2020-02-15 DIAGNOSIS — E66812 Obesity, class 2: Secondary | ICD-10-CM

## 2020-02-15 NOTE — Progress Notes (Signed)
Chief Complaint:   OBESITY Kelly [name] is here to discuss her progress with her obesity treatment plan along with follow-up of her obesity related diagnoses. Kelly [name] is on the Category 1 Plan and keeping a food journal and adhering to recommended goals of 300-400 calories and 35 grams of protein at supper daily and states she is following her eating plan approximately 90% of the time. Kelly [name] states she is doing 0 minutes 0 times per week.  Today's visit was #: 5 Starting weight: 223 lbs Starting date: 12/08/2019 Today's weight: 206 lbs Today's date: 02/15/2020 Total lbs lost to date: 17 Total lbs lost since last in-office visit: 2  Interim History: Kelly [name]'s food recall: breakfast is 2 eggs and Kelly [name] bacon, cheese, and bread. Lunch is salad with 4 oz of meat, and dinner is 3-4 oz of meat with veggies. Snack is Halo pop. Her hunger is controlled. She is going to start adding resistance training in.  Subjective:   1. Type 2 diabetes mellitus with stage 3 chronic kidney disease, without long-term current use of insulin, unspecified whether stage 3a or 3b CKD (HCC) Kelly [name] is on metformin. Her primary care physician gave her a sample of Trulicity which she has not yet started. Her fasting BGs range between 111 and 135. Last A1c was checked yesterday at her primary care physician's office.  2. Hyperlipidemia associated with type 2 diabetes mellitus (HCC) Kelly [name] is on atorvastatin, and she is followed by Kelly [name].  Assessment/Plan:   1. Type 2 diabetes mellitus with stage 3 chronic kidney disease, without long-term current use of insulin, unspecified whether stage 3a or 3b CKD (HCC) Good blood sugar control is important to decrease the likelihood of diabetic complications such as nephropathy, neuropathy, limb loss, blindness, coronary artery disease, and death. Intensive lifestyle modification including diet, exercise and weight loss are the first line of treatment for diabetes. Kelly [name] will start  Trulicity this weekend, as prescribed by Kelly [name].  2. Hyperlipidemia associated with type 2 diabetes mellitus (HCC) Cardiovascular risk and specific lipid/LDL goals reviewed. We discussed several lifestyle modifications today. Kelly [name] will continue her medications, and will follow up with Kelly [name]. She will continue to work on diet, exercise and weight loss efforts. Orders and follow up as documented in patient record.   Counseling Intensive lifestyle modifications are the first line treatment for this issue. . Dietary changes: Increase soluble fiber. Decrease simple carbohydrates. . Exercise changes: Moderate to vigorous-intensity aerobic activity 150 minutes per week if tolerated. . Lipid-lowering medications: see documented in medical record.  3. Class 2 severe obesity with serious comorbidity and body mass index (BMI) of 35.0 to 35.9 in adult, unspecified obesity type Kelly [name]) Kelly [name] is currently in the action stage of change. As such, her goal is to continue with weight loss efforts. She has agreed to keeping a food journal and adhering to recommended goals of 1000-1100 calories and 75 grams of protein daily.   Exercise goals: No exercise has been prescribed at this time.  Behavioral modification strategies: meal planning and cooking strategies and keeping healthy foods in the home.  Kelly [name] has agreed to follow-up with our clinic in 3 weeks. She was informed of the importance of frequent follow-up visits to maximize her success with intensive lifestyle modifications for her multiple health conditions.   Objective:   Blood pressure 113/69, pulse 72, temperature 97.8 F (36.6 C), height 5\' 4"  (1.626 m), weight 206 lb (93.4 kg), SpO2 98 %. Body mass index is 35.36 kg/m.  General: Cooperative, alert, well developed, in no acute distress. HEENT: Conjunctivae and lids unremarkable. Cardiovascular: Regular rhythm.  Lungs: Normal work of breathing. Neurologic: No focal deficits.   Lab  Results  Component Value Date   CREATININE 1.2 (A) 11/15/2019   BUN 16 11/15/2019   NA 140 11/15/2019   K 4.4 11/15/2019   CL 101 11/15/2019   Lab Results  Component Value Date   ALT 35 11/15/2019   AST 23 11/15/2019   ALKPHOS 54 11/15/2019   Lab Results  Component Value Date   HGBA1C 7.7 11/15/2019   No results found for: INSULIN Lab Results  Component Value Date   TSH 1.34 04/11/2019   Lab Results  Component Value Date   CHOL 166 04/11/2019   HDL 71 (A) 04/11/2019   TRIG 132 04/11/2019   No results found for: WBC, HGB, HCT, MCV, PLT No results found for: IRON, TIBC, FERRITIN  Obesity Behavioral Intervention:   Approximately 15 minutes were spent on the discussion below.  ASK: We discussed the diagnosis of obesity with Kelly [name] today and Kelly [name] agreed to give [place] permission to discuss obesity behavioral modification therapy today.  ASSESS: Kelly [name] has the diagnosis of obesity and her BMI today is 35.34. Kelly [name] is in the action stage of change.   ADVISE: Kelly [name] was educated on the multiple health risks of obesity as well as the benefit of weight loss to improve her health. She was advised of the need for long term treatment and the importance of lifestyle modifications to improve her current health and to decrease her risk of future health problems.  AGREE: Multiple dietary modification options and treatment options were discussed and Kelly [name] agreed to follow the recommendations documented in the above note.  ARRANGE: Kelly [name] was educated on the importance of frequent visits to treat obesity as outlined per CMS and USPSTF guidelines and agreed to schedule her next follow up appointment today.  Attestation Statements:   Reviewed by clinician on day of visit: allergies, medications, problem list, medical history, surgical history, family history, social history, and previous encounter notes.   Trude Mcburney, am acting as transcriptionist for [employer], PA-C.  I have  reviewed the above documentation for accuracy and completeness, and I agree with the above. Alois Cliche, PA-C

## 2020-02-20 ENCOUNTER — Encounter (INDEPENDENT_AMBULATORY_CARE_PROVIDER_SITE_OTHER): Payer: Self-pay | Admitting: Physician Assistant

## 2020-02-21 NOTE — Telephone Encounter (Signed)
FYI

## 2020-03-07 ENCOUNTER — Encounter (INDEPENDENT_AMBULATORY_CARE_PROVIDER_SITE_OTHER): Payer: Self-pay | Admitting: Physician Assistant

## 2020-03-07 ENCOUNTER — Other Ambulatory Visit: Payer: Self-pay

## 2020-03-07 ENCOUNTER — Ambulatory Visit (INDEPENDENT_AMBULATORY_CARE_PROVIDER_SITE_OTHER): Payer: Medicare Other | Admitting: Physician Assistant

## 2020-03-07 VITALS — BP 125/73 | HR 68 | Temp 98.0°F | Ht 64.0 in | Wt 201.0 lb

## 2020-03-07 DIAGNOSIS — E1122 Type 2 diabetes mellitus with diabetic chronic kidney disease: Secondary | ICD-10-CM | POA: Diagnosis not present

## 2020-03-07 DIAGNOSIS — E669 Obesity, unspecified: Secondary | ICD-10-CM | POA: Diagnosis not present

## 2020-03-07 DIAGNOSIS — N183 Chronic kidney disease, stage 3 unspecified: Secondary | ICD-10-CM | POA: Diagnosis not present

## 2020-03-07 DIAGNOSIS — Z6834 Body mass index (BMI) 34.0-34.9, adult: Secondary | ICD-10-CM

## 2020-03-07 DIAGNOSIS — E559 Vitamin D deficiency, unspecified: Secondary | ICD-10-CM

## 2020-03-10 ENCOUNTER — Encounter (INDEPENDENT_AMBULATORY_CARE_PROVIDER_SITE_OTHER): Payer: Self-pay | Admitting: Physician Assistant

## 2020-03-12 NOTE — Progress Notes (Signed)
Chief Complaint:   OBESITY Kelly [name] is here to discuss her progress with her obesity treatment plan along with follow-up of her obesity related diagnoses. Kelly [name] is keeping a food journal and adhering to recommended goals of 1000-1100 calories and 75 grams of protein and states she is following her eating plan approximately 90-95% of the time. Kelly [name] states she is walking 15 minutes 7 times per week.  Today's visit was #: 6 Starting weight: 223 lbs Starting date: 12/08/2019 Today's weight: 201 lbs Today's date: 03/07/2020 Total lbs lost to date: 22 Total lbs lost since last in-office visit: 5  Interim History: Kelly [name] is doing a great job with her weight loss and plan. She is averaging 75 grams of protein daily and around 1000 calories. She has increased her protein the last 3-4 days and has more energy.  Subjective:   Type 2 diabetes mellitus with stage 3 chronic kidney disease, without long-term current use of insulin, unspecified whether stage 3a or 3b CKD (HCC). Last A1c was much improved at 6.4. Fasting blood sugars are averaging 94-123. Kelly [name] is on Trulicity and denies hypoglycemia.  Lab Results  Component Value Date   HGBA1C 7.7 11/15/2019   Lab Results  Component Value Date   CREATININE 1.2 (A) 11/15/2019   No results found for: INSULIN  Vitamin D deficiency. Kelly [name] is on Vitamin D daily, which she is tolerating well.   Ref. Range 04/11/2019 00:00  Vitamin D, 25-Hydroxy Unknown 50.4   Assessment/Plan:   Type 2 diabetes mellitus with stage 3 chronic kidney disease, without long-term current use of insulin, unspecified whether stage 3a or 3b CKD (HCC). Good blood sugar control is important to decrease the likelihood of diabetic complications such as nephropathy, neuropathy, limb loss, blindness, coronary artery disease, and death. Intensive lifestyle modification including diet, exercise and weight loss are the first line of treatment for diabetes. Kelly [name] will  follow-up with Kelly [name], her PCP, as scheduled and continue her medication as directed.   Vitamin D deficiency. Low Vitamin D level contributes to fatigue and are associated with obesity, breast, and colon cancer. She agrees to continue to take Vitamin D as directed and will follow-up for routine testing of Vitamin D, at least 2-3 times per year to avoid over-replacement.  Class 1 obesity with serious comorbidity and body mass index (BMI) of 34.0 to 34.9 in adult, unspecified obesity type.  Kelly [name] is currently in the action stage of change. As such, her goal is to continue with weight loss efforts. She has agreed to keeping a food journal and adhering to recommended goals of 1000-1100 calories and 75 grams of protein daily.   Exercise goals: Older adults should follow the adult guidelines. When older adults cannot meet the adult guidelines, they should be as physically active as their abilities and conditions will allow.   Behavioral modification strategies: meal planning and cooking strategies and keeping healthy foods in the home.  Kelly [name] has agreed to follow-up with our clinic in 3 weeks. She was informed of the importance of frequent follow-up visits to maximize her success with intensive lifestyle modifications for her multiple health conditions.   Objective:   Blood pressure 125/73, pulse 68, temperature 98 F (36.7 C), height 5\' 4"  (1.626 m), weight 201 lb (91.2 kg), SpO2 99 %. Body mass index is 34.5 kg/m.  General: Cooperative, alert, well developed, in no acute distress. HEENT: Conjunctivae and lids unremarkable. Cardiovascular: Regular rhythm.  Lungs: Normal work of breathing. Neurologic: No  focal deficits.   Lab Results  Component Value Date   CREATININE 1.2 (A) 11/15/2019   BUN 16 11/15/2019   NA 140 11/15/2019   K 4.4 11/15/2019   CL 101 11/15/2019   Lab Results  Component Value Date   ALT 35 11/15/2019   AST 23 11/15/2019   ALKPHOS 54 11/15/2019   Lab Results    Component Value Date   HGBA1C 7.7 11/15/2019   No results found for: INSULIN Lab Results  Component Value Date   TSH 1.34 04/11/2019   Lab Results  Component Value Date   CHOL 166 04/11/2019   HDL 71 (A) 04/11/2019   TRIG 132 04/11/2019   No results found for: WBC, HGB, HCT, MCV, PLT No results found for: IRON, TIBC, FERRITIN  Obesity Behavioral Intervention:   Approximately 15 minutes were spent on the discussion below.  ASK: We discussed the diagnosis of obesity with Kelly [name] today and Kelly [name] agreed to give [place] permission to discuss obesity behavioral modification therapy today.  ASSESS: Kelly [name] has the diagnosis of obesity and her BMI today is 34.6. Kelly [name] is in the action stage of change.   ADVISE: Kelly [name] was educated on the multiple health risks of obesity as well as the benefit of weight loss to improve her health. She was advised of the need for long term treatment and the importance of lifestyle modifications to improve her current health and to decrease her risk of future health problems.  AGREE: Multiple dietary modification options and treatment options were discussed and Kelly [name] agreed to follow the recommendations documented in the above note.  ARRANGE: Kelly [name] was educated on the importance of frequent visits to treat obesity as outlined per CMS and USPSTF guidelines and agreed to schedule her next follow up appointment today.  Attestation Statements:   Reviewed by clinician on day of visit: allergies, medications, problem list, medical history, surgical history, family history, social history, and previous encounter notes.  IMarianna [name], am acting as transcriptionist for Kelly Cliche, PA-C   I have reviewed the above documentation for accuracy and completeness, and I agree with the above. Kelly Cliche, PA-C

## 2020-03-12 NOTE — Telephone Encounter (Signed)
Thank you :)

## 2020-03-22 ENCOUNTER — Other Ambulatory Visit: Payer: Self-pay

## 2020-03-22 ENCOUNTER — Ambulatory Visit (INDEPENDENT_AMBULATORY_CARE_PROVIDER_SITE_OTHER): Payer: Medicare Other | Admitting: Physician Assistant

## 2020-03-22 ENCOUNTER — Encounter (INDEPENDENT_AMBULATORY_CARE_PROVIDER_SITE_OTHER): Payer: Self-pay | Admitting: Physician Assistant

## 2020-03-22 VITALS — BP 104/64 | HR 67 | Temp 98.4°F | Ht 64.0 in | Wt 202.0 lb

## 2020-03-22 DIAGNOSIS — E669 Obesity, unspecified: Secondary | ICD-10-CM

## 2020-03-22 DIAGNOSIS — N1831 Chronic kidney disease, stage 3a: Secondary | ICD-10-CM

## 2020-03-22 DIAGNOSIS — Z6834 Body mass index (BMI) 34.0-34.9, adult: Secondary | ICD-10-CM

## 2020-03-22 DIAGNOSIS — E7849 Other hyperlipidemia: Secondary | ICD-10-CM

## 2020-03-22 DIAGNOSIS — E1122 Type 2 diabetes mellitus with diabetic chronic kidney disease: Secondary | ICD-10-CM | POA: Diagnosis not present

## 2020-03-22 NOTE — Progress Notes (Signed)
Chief Complaint:   OBESITY Kelly [name] is here to discuss her progress with her obesity treatment plan along with follow-up of her obesity related diagnoses. Kelly [name] is keeping a food journal and adhering to recommended goals of 1000-1100 calories and 75 grams of protein and states she is following her eating plan approximately 80% of the time. Kelly [name] states she is walking 15 minutes 7 times per week.  Today's visit was #: 7 Starting weight: 223 lbs Starting date: 12/08/2019 Today's weight: 202 lbs Today's date: 03/22/2020 Total lbs lost to date: 21 Total lbs lost since last in-office visit: 0  Interim History: Kelly [name] states she is tired of the plan and she has not been journaling regularly the last week. She has been drinking more water first thing in the morning. She has been eating candy and noticed an increase in cravings.  Subjective:   Other hyperlipidemia. Kelly [name] is on Lipitor once weekly, which she is tolerating well. PCP is following her lipids.   Lab Results  Component Value Date   CHOL 166 04/11/2019   HDL 71 (A) 04/11/2019   TRIG 132 04/11/2019   Lab Results  Component Value Date   ALT 35 11/15/2019   AST 23 11/15/2019   ALKPHOS 54 11/15/2019   The 10-year ASCVD risk score Denman George DC Jr., et al., 2013) is: 25.7%*   Values used to calculate the score:     Age: 74 years     Sex: Female     Is Non-Hispanic African American: No     Diabetic: Yes     Tobacco smoker: No     Systolic Blood Pressure: 104 mmHg     Is BP treated: Yes     HDL Cholesterol: 71 mg/dL*     Total Cholesterol: 166 mg/dL*     * - Cholesterol units were assumed for this score calculation  Type 2 diabetes mellitus with stage 3a chronic kidney disease, without long-term current use of insulin (HCC). Kelly [name] is on Trulicity 1.5, which she is tolerating well. Fasting blood sugars 95-130. No polyphagia. No hypoglycemia.   Lab Results  Component Value Date   HGBA1C 7.7 11/15/2019   Lab Results    Component Value Date   CREATININE 1.2 (A) 11/15/2019   No results found for: INSULIN  Assessment/Plan:   Other hyperlipidemia. Cardiovascular risk and specific lipid/LDL goals reviewed.  We discussed several lifestyle modifications today and Kelly [name] will continue to work on diet, exercise and weight loss efforts. Orders and follow up as documented in patient record. She will follow-up with her PCP as scheduled.  Counseling Intensive lifestyle modifications are the first line treatment for this issue. . Dietary changes: Increase soluble fiber. Decrease simple carbohydrates. . Exercise changes: Moderate to vigorous-intensity aerobic activity 150 minutes per week if tolerated. . Lipid-lowering medications: see documented in medical record.  Type 2 diabetes mellitus with stage 3a chronic kidney disease, without long-term current use of insulin (HCC). Good blood sugar control is important to decrease the likelihood of diabetic complications such as nephropathy, neuropathy, limb loss, blindness, coronary artery disease, and death. Intensive lifestyle modification including diet, exercise and weight loss are the first line of treatment for diabetes. Kelly [name] will continue her medication as directed and follow-up with her PCP in December as scheduled.  Class 1 obesity with serious comorbidity and body mass index (BMI) of 34.0 to 34.9 in adult, unspecified obesity type.  Kelly [name] is currently in the action stage of change. As such, her  goal is to continue with weight loss efforts. She has agreed to keeping a food journal and adhering to recommended goals of 1000-1100 calories and 75 grams of protein daily.   Exercise goals: Older adults should follow the adult guidelines. When older adults cannot meet the adult guidelines, they should be as physically active as their abilities and conditions will allow.   Behavioral modification strategies: decreasing simple carbohydrates and keeping a strict food  journal.  Kelly [name] has agreed to follow-up with our clinic in 2 weeks. She was informed of the importance of frequent follow-up visits to maximize her success with intensive lifestyle modifications for her multiple health conditions.   Objective:   Blood pressure 104/64, pulse 67, temperature 98.4 F (36.9 C), height 5\' 4"  (1.626 m), weight 202 lb (91.6 kg), SpO2 99 %. Body mass index is 34.67 kg/m.  General: Cooperative, alert, well developed, in no acute distress. HEENT: Conjunctivae and lids unremarkable. Cardiovascular: Regular rhythm.  Lungs: Normal work of breathing. Neurologic: No focal deficits.   Lab Results  Component Value Date   CREATININE 1.2 (A) 11/15/2019   BUN 16 11/15/2019   NA 140 11/15/2019   K 4.4 11/15/2019   CL 101 11/15/2019   Lab Results  Component Value Date   ALT 35 11/15/2019   AST 23 11/15/2019   ALKPHOS 54 11/15/2019   Lab Results  Component Value Date   HGBA1C 7.7 11/15/2019   No results found for: INSULIN Lab Results  Component Value Date   TSH 1.34 04/11/2019   Lab Results  Component Value Date   CHOL 166 04/11/2019   HDL 71 (A) 04/11/2019   TRIG 132 04/11/2019   No results found for: WBC, HGB, HCT, MCV, PLT No results found for: IRON, TIBC, FERRITIN  Obesity Behavioral Intervention:   Approximately 15 minutes were spent on the discussion below.  ASK: We discussed the diagnosis of obesity with 04/13/2019 today and Kelly [name] agreed to give Kelly [name] permission to discuss obesity behavioral modification therapy today.  ASSESS: Kelly [name] has the diagnosis of obesity and her BMI today is 34.7. Kelly [name] is in the action stage of change.   ADVISE: Kelly [name] was educated on the multiple health risks of obesity as well as the benefit of weight loss to improve her health. She was advised of the need for long term treatment and the importance of lifestyle modifications to improve her current health and to decrease her risk of future health  problems.  AGREE: Multiple dietary modification options and treatment options were discussed and Kelly [name] agreed to follow the recommendations documented in the above note.  ARRANGE: Kelly [name] was educated on the importance of frequent visits to treat obesity as outlined per CMS and USPSTF guidelines and agreed to schedule her next follow up appointment today.  Attestation Statements:   Reviewed by clinician on day of visit: allergies, medications, problem list, medical history, surgical history, family history, social history, and previous encounter notes.  IBonita [name], am acting as transcriptionist for Kelly Payment, PA-C   I have reviewed the above documentation for accuracy and completeness, and I agree with the above. Alois Cliche, PA-C

## 2020-03-27 ENCOUNTER — Ambulatory Visit (INDEPENDENT_AMBULATORY_CARE_PROVIDER_SITE_OTHER): Payer: Medicare Other | Admitting: Physician Assistant

## 2020-03-28 ENCOUNTER — Other Ambulatory Visit: Payer: Self-pay | Admitting: Family Medicine

## 2020-03-28 DIAGNOSIS — Z1231 Encounter for screening mammogram for malignant neoplasm of breast: Secondary | ICD-10-CM

## 2020-04-10 ENCOUNTER — Encounter (INDEPENDENT_AMBULATORY_CARE_PROVIDER_SITE_OTHER): Payer: Self-pay | Admitting: Physician Assistant

## 2020-04-10 ENCOUNTER — Ambulatory Visit (INDEPENDENT_AMBULATORY_CARE_PROVIDER_SITE_OTHER): Payer: Medicare Other | Admitting: Physician Assistant

## 2020-04-10 ENCOUNTER — Other Ambulatory Visit: Payer: Self-pay

## 2020-04-10 VITALS — BP 103/62 | HR 63 | Temp 98.6°F | Ht 64.0 in | Wt 201.0 lb

## 2020-04-10 DIAGNOSIS — E785 Hyperlipidemia, unspecified: Secondary | ICD-10-CM

## 2020-04-10 DIAGNOSIS — Z6834 Body mass index (BMI) 34.0-34.9, adult: Secondary | ICD-10-CM

## 2020-04-10 DIAGNOSIS — E559 Vitamin D deficiency, unspecified: Secondary | ICD-10-CM

## 2020-04-10 DIAGNOSIS — E1169 Type 2 diabetes mellitus with other specified complication: Secondary | ICD-10-CM

## 2020-04-10 DIAGNOSIS — E669 Obesity, unspecified: Secondary | ICD-10-CM

## 2020-04-11 NOTE — Progress Notes (Signed)
Chief Complaint:   OBESITY Kelly [name] is here to discuss her progress with her obesity treatment plan along with follow-up of her obesity related diagnoses. Kelly [name] is on the Category 1 Plan and journaling and states she is following her eating plan approximately 80% of the time. Kelly [name] states she is walking 15 minutes 7 times per week.  Today's visit was #: 8 Starting weight: 223 lbs Starting date: 12/08/2019 Today's weight: 201 lbs Today's date: 04/10/2020 Total lbs lost to date: 22 Total lbs lost since last in-office visit: 1  Interim History: Kelly [name] was at the beach last week and was very mindful of her choices. She has not been journaling consistently, but is ready to restart.  Subjective:   Type 2 diabetes mellitus with hyperlipidemia (HCC). Fasting blood sugars 103-133. Last A1c 6.4 with her PCP. Kelly [name] is scheduled for labs next month with her PCP. She is on Trulicity and denies nausea, vomiting, or diarrhea.   Lab Results  Component Value Date   HGBA1C 7.7 11/15/2019   Lab Results  Component Value Date   CREATININE 1.2 (A) 11/15/2019   No results found for: INSULIN  Vitamin D deficiency. Kelly [name] is on Vitamin D 5,000 units. Energy is reported to be good.   Ref. Range 04/11/2019 00:00  Vitamin D, 25-Hydroxy Unknown 50.4   Assessment/Plan:   Type 2 diabetes mellitus with hyperlipidemia (HCC). Good blood sugar control is important to decrease the likelihood of diabetic complications such as nephropathy, neuropathy, limb loss, blindness, coronary artery disease, and death. Intensive lifestyle modification including diet, exercise and weight loss are the first line of treatment for diabetes. Kelly [name] will continue Trulicity as directed and follow-up with her PCP as scheduled.  Vitamin D deficiency. Low Vitamin D level contributes to fatigue and are associated with obesity, breast, and colon cancer. She agrees to continue to take Vitamin D as directed and will follow-up for  routine testing of Vitamin D next month.  Class 1 obesity with serious comorbidity and body mass index (BMI) of 34.0 to 34.9 in adult, unspecified obesity type.  Kelly [name] is currently in the action stage of change. As such, her goal is to continue with weight loss efforts. She has agreed to keeping a food journal and adhering to recommended goals of 1000-1100 calories and 75 grams of protein daily.   Exercise goals: Older adults should follow the adult guidelines. When older adults cannot meet the adult guidelines, they should be as physically active as their abilities and conditions will allow.   Behavioral modification strategies: planning for success and keeping a strict food journal.  Kelly [name] has agreed to follow-up with our clinic in 2 weeks. She was informed of the importance of frequent follow-up visits to maximize her success with intensive lifestyle modifications for her multiple health conditions.   Objective:   Blood pressure 103/62, pulse 63, temperature 98.6 F (37 C), height 5\' 4"  (1.626 m), weight 201 lb (91.2 kg), SpO2 98 %. Body mass index is 34.5 kg/m.  General: Cooperative, alert, well developed, in no acute distress. HEENT: Conjunctivae and lids unremarkable. Cardiovascular: Regular rhythm.  Lungs: Normal work of breathing. Neurologic: No focal deficits.   Lab Results  Component Value Date   CREATININE 1.2 (A) 11/15/2019   BUN 16 11/15/2019   NA 140 11/15/2019   K 4.4 11/15/2019   CL 101 11/15/2019   Lab Results  Component Value Date   ALT 35 11/15/2019   AST 23 11/15/2019   ALKPHOS  54 11/15/2019   Lab Results  Component Value Date   HGBA1C 7.7 11/15/2019   No results found for: INSULIN Lab Results  Component Value Date   TSH 1.34 04/11/2019   Lab Results  Component Value Date   CHOL 166 04/11/2019   HDL 71 (A) 04/11/2019   TRIG 132 04/11/2019   No results found for: WBC, HGB, HCT, MCV, PLT No results found for: IRON, TIBC, FERRITIN  Obesity  Behavioral Intervention:   Approximately 15 minutes were spent on the discussion below.  ASK: We discussed the diagnosis of obesity with Kelly [name] today and Kelly [name] agreed to give [place] permission to discuss obesity behavioral modification therapy today.  ASSESS: Kelly [name] has the diagnosis of obesity and her BMI today is 34.5. Kelly [name] is in the action stage of change.   ADVISE: Kelly [name] was educated on the multiple health risks of obesity as well as the benefit of weight loss to improve her health. She was advised of the need for long term treatment and the importance of lifestyle modifications to improve her current health and to decrease her risk of future health problems.  AGREE: Multiple dietary modification options and treatment options were discussed and Kelly [name] agreed to follow the recommendations documented in the above note.  ARRANGE: Kelly [name] was educated on the importance of frequent visits to treat obesity as outlined per CMS and USPSTF guidelines and agreed to schedule her next follow up appointment today.  Attestation Statements:   Reviewed by clinician on day of visit: allergies, medications, problem list, medical history, surgical history, family history, social history, and previous encounter notes.  IMarianna Payment, am acting as transcriptionist for Alois Cliche, PA-C   I have reviewed the above documentation for accuracy and completeness, and I agree with the above. Alois Cliche, PA-C

## 2020-05-02 ENCOUNTER — Encounter (INDEPENDENT_AMBULATORY_CARE_PROVIDER_SITE_OTHER): Payer: Self-pay | Admitting: Physician Assistant

## 2020-05-02 ENCOUNTER — Ambulatory Visit (INDEPENDENT_AMBULATORY_CARE_PROVIDER_SITE_OTHER): Payer: Medicare Other | Admitting: Physician Assistant

## 2020-05-02 ENCOUNTER — Other Ambulatory Visit: Payer: Self-pay

## 2020-05-02 VITALS — BP 116/68 | HR 72 | Temp 98.1°F | Ht 64.0 in | Wt 198.0 lb

## 2020-05-02 DIAGNOSIS — E7849 Other hyperlipidemia: Secondary | ICD-10-CM

## 2020-05-02 DIAGNOSIS — Z6834 Body mass index (BMI) 34.0-34.9, adult: Secondary | ICD-10-CM

## 2020-05-02 DIAGNOSIS — E669 Obesity, unspecified: Secondary | ICD-10-CM

## 2020-05-02 DIAGNOSIS — E1169 Type 2 diabetes mellitus with other specified complication: Secondary | ICD-10-CM | POA: Diagnosis not present

## 2020-05-02 DIAGNOSIS — E785 Hyperlipidemia, unspecified: Secondary | ICD-10-CM

## 2020-05-02 NOTE — Progress Notes (Signed)
Chief Complaint:   OBESITY Kelly [name] is here to discuss her progress with her obesity treatment plan along with follow-up of her obesity related diagnoses. Kelly [name] is on keeping a food journal and adhering to recommended goals of 1000-1100 calories and 75 grams of protein and states she is following her eating plan approximately 85% of the time. Kelly [name] states she is walking for 15 minutes 3 times per week.  Today's visit was #: 9 Starting weight: 223 lbs Starting date: 12/08/2019 Today's weight: 198 lbs Today's date: 05/02/2020 Total lbs lost to date: 25 lbs Total lbs lost since last in-office visit: 3 lbs  Interim History: Kelly [name] continues to do very well with weight loss.  She is back to journaling consistently and is meeting her calories and protein goals daily.  PCP will check labs in 2 days.  Subjective:   1. Type 2 diabetes mellitus with hyperlipidemia (HCC) Fasting 98-129.  On Trulicity.  No hypoglycemia.  No polyphagia.  Labs to be drawn by PCP in 2 days.  Lab Results  Component Value Date   HGBA1C 7.7 11/15/2019   Lab Results  Component Value Date   CREATININE 1.2 (A) 11/15/2019   2. Other hyperlipidemia Kelly [name] has hyperlipidemia and has been trying to improve her cholesterol levels with intensive lifestyle modification including a low saturated fat diet, exercise and weight loss. She denies any chest pain, claudication or myalgias.  On Lipitor.    Lab Results  Component Value Date   ALT 35 11/15/2019   AST 23 11/15/2019   ALKPHOS 54 11/15/2019   Lab Results  Component Value Date   CHOL 166 04/11/2019   HDL 71 (A) 04/11/2019   TRIG 132 04/11/2019   Assessment/Plan:   1. Type 2 diabetes mellitus with hyperlipidemia (HCC) Good blood sugar control is important to decrease the likelihood of diabetic complications such as nephropathy, neuropathy, limb loss, blindness, coronary artery disease, and death. Intensive lifestyle modification including diet, exercise and  weight loss are the first line of treatment for diabetes.  Continue medications and weight loss.  2. Other hyperlipidemia Cardiovascular risk and specific lipid/LDL goals reviewed.  We discussed several lifestyle modifications today and Kelly [name] will continue to work on diet, exercise and weight loss efforts. Orders and follow up as documented in patient record.  Continue with medication and weight loss.  PCP to check labs in 2 days.  Counseling Intensive lifestyle modifications are the first line treatment for this issue.  Dietary changes: Increase soluble fiber. Decrease simple carbohydrates.  Exercise changes: Moderate to vigorous-intensity aerobic activity 150 minutes per week if tolerated.  Lipid-lowering medications: see documented in medical record.  3. Class 1 obesity with serious comorbidity and body mass index (BMI) of 34.0 to 34.9 in adult, unspecified obesity type  Kelly [name] is currently in the action stage of change. As such, her goal is to continue with weight loss efforts. She has agreed to keeping a food journal and adhering to recommended goals of 1000-1100 calories and 75 grams of protein daily.   Exercise goals: As is.  Behavioral modification strategies: meal planning and cooking strategies and holiday eating strategies .  Kelly [name] has agreed to follow-up with our clinic in 3 weeks. She was informed of the importance of frequent follow-up visits to maximize her success with intensive lifestyle modifications for her multiple health conditions.   Objective:   Blood pressure 116/68, pulse 72, temperature 98.1 F (36.7 C), height 5\' 4"  (1.626 m), weight 198 lb (89.8 kg),  SpO2 98 %. Body mass index is 33.99 kg/m.  General: Cooperative, alert, well developed, in no acute distress. HEENT: Conjunctivae and lids unremarkable. Cardiovascular: Regular rhythm.  Lungs: Normal work of breathing. Neurologic: No focal deficits.   Lab Results  Component Value Date   CREATININE 1.2 (A)  11/15/2019   BUN 16 11/15/2019   NA 140 11/15/2019   K 4.4 11/15/2019   CL 101 11/15/2019   Lab Results  Component Value Date   ALT 35 11/15/2019   AST 23 11/15/2019   ALKPHOS 54 11/15/2019   Lab Results  Component Value Date   HGBA1C 7.7 11/15/2019   Lab Results  Component Value Date   TSH 1.34 04/11/2019   Lab Results  Component Value Date   CHOL 166 04/11/2019   HDL 71 (A) 04/11/2019   TRIG 132 04/11/2019   Obesity Behavioral Intervention:   Approximately 15 minutes were spent on the discussion below.  ASK: We discussed the diagnosis of obesity with Kelly [name] today and Kelly [name] agreed to give [place] permission to discuss obesity behavioral modification therapy today.  ASSESS: Kelly [name] has the diagnosis of obesity and her BMI today is 34.0. Kelly [name] is in the action stage of change.   ADVISE: Kelly [name] was educated on the multiple health risks of obesity as well as the benefit of weight loss to improve her health. She was advised of the need for long term treatment and the importance of lifestyle modifications to improve her current health and to decrease her risk of future health problems.  AGREE: Multiple dietary modification options and treatment options were discussed and Kelly [name] agreed to follow the recommendations documented in the above note.  ARRANGE: Kelly [name] was educated on the importance of frequent visits to treat obesity as outlined per CMS and USPSTF guidelines and agreed to schedule her next follow up appointment today.  Attestation Statements:   Reviewed by clinician on day of visit: allergies, medications, problem list, medical history, surgical history, family history, social history, and previous encounter notes.  I, Insurance claims handler, CMA, am acting as transcriptionist for Alois Cliche, PA-C  I have reviewed the above documentation for accuracy and completeness, and I agree with the above. Alois Cliche, PA-C

## 2020-05-04 LAB — MICROALBUMIN, URINE: Microalb, Ur: 2.28

## 2020-05-04 LAB — LIPID PANEL
Cholesterol: 156 (ref 0–200)
HDL: 68 (ref 35–70)
LDL Cholesterol: 71
Triglycerides: 93 (ref 40–160)

## 2020-05-04 LAB — CBC: RBC: 4.47 (ref 3.87–5.11)

## 2020-05-04 LAB — CBC AND DIFFERENTIAL
HCT: 42 (ref 36–46)
Hemoglobin: 14.5 (ref 12.0–16.0)
WBC: 4

## 2020-05-04 LAB — HEPATIC FUNCTION PANEL
ALT: 22 (ref 7–35)
AST: 17 (ref 13–35)

## 2020-05-04 LAB — BASIC METABOLIC PANEL
CO2: 32 — AB (ref 13–22)
Chloride: 96 — AB (ref 99–108)
Creatinine: 1 (ref <=1.1)
Glucose: 87
Potassium: 3.7 (ref 3.4–5.3)
Sodium: 138 (ref 137–147)

## 2020-05-04 LAB — COMPREHENSIVE METABOLIC PANEL: Calcium: 10.3 (ref 8.7–10.7)

## 2020-05-04 LAB — TSH: TSH: 1.34 (ref <=5.90)

## 2020-05-08 ENCOUNTER — Ambulatory Visit
Admission: RE | Admit: 2020-05-08 | Discharge: 2020-05-08 | Disposition: A | Discharge status: Home / Self Care | Payer: Medicare Other | Source: Ambulatory Visit | Attending: Family Medicine | Admitting: Family Medicine

## 2020-05-08 ENCOUNTER — Other Ambulatory Visit: Payer: Self-pay

## 2020-05-08 DIAGNOSIS — Z1231 Encounter for screening mammogram for malignant neoplasm of breast: Secondary | ICD-10-CM

## 2020-05-14 ENCOUNTER — Other Ambulatory Visit: Payer: Self-pay

## 2020-05-14 ENCOUNTER — Ambulatory Visit
Admission: RE | Admit: 2020-05-14 | Discharge: 2020-05-14 | Disposition: A | Discharge status: Home / Self Care | Payer: Medicare Other | Source: Ambulatory Visit | Attending: Family Medicine | Admitting: Family Medicine

## 2020-05-22 ENCOUNTER — Ambulatory Visit (INDEPENDENT_AMBULATORY_CARE_PROVIDER_SITE_OTHER): Payer: Medicare Other | Admitting: Physician Assistant

## 2020-05-24 ENCOUNTER — Ambulatory Visit (INDEPENDENT_AMBULATORY_CARE_PROVIDER_SITE_OTHER): Payer: Medicare Other | Admitting: Physician Assistant

## 2020-05-24 ENCOUNTER — Encounter (INDEPENDENT_AMBULATORY_CARE_PROVIDER_SITE_OTHER): Payer: Self-pay | Admitting: Physician Assistant

## 2020-05-24 DIAGNOSIS — E559 Vitamin D deficiency, unspecified: Secondary | ICD-10-CM

## 2020-05-24 DIAGNOSIS — Z6833 Body mass index (BMI) 33.0-33.9, adult: Secondary | ICD-10-CM

## 2020-05-24 DIAGNOSIS — E785 Hyperlipidemia, unspecified: Secondary | ICD-10-CM

## 2020-05-24 DIAGNOSIS — E1169 Type 2 diabetes mellitus with other specified complication: Secondary | ICD-10-CM | POA: Diagnosis not present

## 2020-05-24 DIAGNOSIS — E669 Obesity, unspecified: Secondary | ICD-10-CM | POA: Diagnosis not present

## 2020-05-28 NOTE — Progress Notes (Signed)
TeleHealth Visit:  Due to the COVID-19 pandemic, this visit was completed with telemedicine (audio/video) technology to reduce patient and provider exposure as well as to preserve personal protective equipment.   Kelly [name] has verbally consented to this TeleHealth visit. The patient is located at home, the provider is located at the [facility] office. The participants in this visit include the listed provider and patient. The visit was conducted today via MyChart video.   Chief Complaint: OBESITY Kelly [name] is here to discuss her progress with her obesity treatment plan along with follow-up of her obesity related diagnoses. Kelly [name] is on keeping a food journal and adhering to recommended goals of 1000-1100 calories and 75 grams of protein daily and states she is following her eating plan approximately 70% of the time. Kelly [name] states she was walking some.   Today's visit was #: 10 Starting weight: 223 lbs Starting date: 12/08/2019  Interim History: Kelly [name] reports that she was very mindful of her portions and food choices over the holidays, although she did not journal. She may be going on a cruise in 2 weeks. She states her weight at home was 196.6 today.  Subjective:   1. Type 2 diabetes mellitus with hyperlipidemia (HCC) Kelly [name]'s fasting BGs range between 94 and 135. She is on Trulicity, and she denies polyphagia or hypoglycemia.  2. Vitamin D deficiency Kelly [name] recently had her Vit D drawn with her primary care physician. She is on OTC Vit D 5,000 units daily.  Assessment/Plan:   1. Type 2 diabetes mellitus with hyperlipidemia (HCC) Good blood sugar control is important to decrease the likelihood of diabetic complications such as nephropathy, neuropathy, limb loss, blindness, coronary artery disease, and death. Intensive lifestyle modification including diet, exercise and weight loss are the first line of treatment for diabetes. Kelly [name] is awaiting her A1c results from her primary care  physician, and she will continue her medications and meal plan.  2. Vitamin D deficiency Low Vitamin D level contributes to fatigue and are associated with obesity, breast, and colon cancer. Kelly [name] agreed to continue taking OTC Vitamin D 5,000 IU daily, and she is to provide [place] with her labs from her primary care physician. She will follow-up for routine testing of Vitamin D, at least 2-3 times per year to avoid over-replacement.  3. Class 1 obesity with serious comorbidity and body mass index (BMI) of 33.0 to 33.9 in adult, unspecified obesity type Kelly [name] is currently in the action stage of change. As such, her goal is to continue with weight loss efforts. She has agreed to keeping a food journal and adhering to recommended goals of 1000-1100 calories and 75 grams of protein daily.   Exercise goals: No exercise has been prescribed at this time.  Behavioral modification strategies: meal planning and cooking strategies and keeping a strict food journal.  Kelly [name] has agreed to follow-up with our clinic in 2 to 3 weeks. She was informed of the importance of frequent follow-up visits to maximize her success with intensive lifestyle modifications for her multiple health conditions.  Objective:   VITALS: Per patient if applicable, see vitals. GENERAL: Alert and in no acute distress. CARDIOPULMONARY: No increased WOB. Speaking in clear sentences.  PSYCH: Pleasant and cooperative. Speech normal rate and rhythm. Affect is appropriate. Insight and judgement are appropriate. Attention is focused, linear, and appropriate.  NEURO: Oriented as arrived to appointment on time with no prompting.   Lab Results  Component Value Date   CREATININE 1.0 05/04/2020  BUN 16 11/15/2019   NA 138 05/04/2020   K 3.7 05/04/2020   CL 96 (A) 05/04/2020   CO2 32 (A) 05/04/2020   Lab Results  Component Value Date   ALT 22 05/04/2020   AST 17 05/04/2020   ALKPHOS 54 11/15/2019   Lab Results  Component Value Date    HGBA1C 7.7 11/15/2019   No results found for: INSULIN Lab Results  Component Value Date   TSH 1.34 05/04/2020   Lab Results  Component Value Date   CHOL 156 05/04/2020   HDL 68 05/04/2020   LDLCALC 71 05/04/2020   TRIG 93 05/04/2020   Lab Results  Component Value Date   WBC 4.0 05/04/2020   HGB 14.5 05/04/2020   HCT 42 05/04/2020   No results found for: IRON, TIBC, FERRITIN  Attestation Statements:   Reviewed by clinician on day of visit: allergies, medications, problem list, medical history, surgical history, family history, social history, and previous encounter notes.   Trude Mcburney, am acting as transcriptionist for [employer], PA-C.  I have reviewed the above documentation for accuracy and completeness, and I agree with the above. Alois Cliche, PA-C

## 2020-06-18 DIAGNOSIS — M1990 Unspecified osteoarthritis, unspecified site: Secondary | ICD-10-CM | POA: Diagnosis not present

## 2020-06-18 DIAGNOSIS — E1169 Type 2 diabetes mellitus with other specified complication: Secondary | ICD-10-CM | POA: Diagnosis not present

## 2020-06-18 DIAGNOSIS — E7800 Pure hypercholesterolemia, unspecified: Secondary | ICD-10-CM | POA: Diagnosis not present

## 2020-06-18 DIAGNOSIS — N183 Chronic kidney disease, stage 3 unspecified: Secondary | ICD-10-CM | POA: Diagnosis not present

## 2020-07-16 DIAGNOSIS — E1169 Type 2 diabetes mellitus with other specified complication: Secondary | ICD-10-CM | POA: Diagnosis not present

## 2020-08-15 DIAGNOSIS — E1169 Type 2 diabetes mellitus with other specified complication: Secondary | ICD-10-CM | POA: Diagnosis not present

## 2020-08-15 DIAGNOSIS — E7800 Pure hypercholesterolemia, unspecified: Secondary | ICD-10-CM | POA: Diagnosis not present

## 2020-08-15 DIAGNOSIS — N183 Chronic kidney disease, stage 3 unspecified: Secondary | ICD-10-CM | POA: Diagnosis not present

## 2020-08-15 DIAGNOSIS — M1990 Unspecified osteoarthritis, unspecified site: Secondary | ICD-10-CM | POA: Diagnosis not present

## 2020-08-16 DIAGNOSIS — E1169 Type 2 diabetes mellitus with other specified complication: Secondary | ICD-10-CM | POA: Diagnosis not present

## 2020-09-11 DIAGNOSIS — E1169 Type 2 diabetes mellitus with other specified complication: Secondary | ICD-10-CM | POA: Diagnosis not present

## 2020-09-11 DIAGNOSIS — N183 Chronic kidney disease, stage 3 unspecified: Secondary | ICD-10-CM | POA: Diagnosis not present

## 2020-09-11 DIAGNOSIS — M1990 Unspecified osteoarthritis, unspecified site: Secondary | ICD-10-CM | POA: Diagnosis not present

## 2020-09-11 DIAGNOSIS — E7800 Pure hypercholesterolemia, unspecified: Secondary | ICD-10-CM | POA: Diagnosis not present

## 2020-09-14 DIAGNOSIS — E1169 Type 2 diabetes mellitus with other specified complication: Secondary | ICD-10-CM | POA: Diagnosis not present

## 2020-09-25 DIAGNOSIS — N183 Chronic kidney disease, stage 3 unspecified: Secondary | ICD-10-CM | POA: Diagnosis not present

## 2020-09-25 DIAGNOSIS — E1169 Type 2 diabetes mellitus with other specified complication: Secondary | ICD-10-CM | POA: Diagnosis not present

## 2020-09-25 DIAGNOSIS — E7800 Pure hypercholesterolemia, unspecified: Secondary | ICD-10-CM | POA: Diagnosis not present

## 2020-09-25 DIAGNOSIS — M1990 Unspecified osteoarthritis, unspecified site: Secondary | ICD-10-CM | POA: Diagnosis not present

## 2020-10-16 DIAGNOSIS — E1169 Type 2 diabetes mellitus with other specified complication: Secondary | ICD-10-CM | POA: Diagnosis not present

## 2020-10-17 DIAGNOSIS — N183 Chronic kidney disease, stage 3 unspecified: Secondary | ICD-10-CM | POA: Diagnosis not present

## 2020-10-17 DIAGNOSIS — R600 Localized edema: Secondary | ICD-10-CM | POA: Diagnosis not present

## 2020-10-17 DIAGNOSIS — E1169 Type 2 diabetes mellitus with other specified complication: Secondary | ICD-10-CM | POA: Diagnosis not present

## 2020-10-17 DIAGNOSIS — E7800 Pure hypercholesterolemia, unspecified: Secondary | ICD-10-CM | POA: Diagnosis not present

## 2020-10-17 DIAGNOSIS — G4733 Obstructive sleep apnea (adult) (pediatric): Secondary | ICD-10-CM | POA: Diagnosis not present

## 2020-10-17 DIAGNOSIS — E559 Vitamin D deficiency, unspecified: Secondary | ICD-10-CM | POA: Diagnosis not present

## 2020-10-17 DIAGNOSIS — Z0000 Encounter for general adult medical examination without abnormal findings: Secondary | ICD-10-CM | POA: Diagnosis not present

## 2020-11-15 DIAGNOSIS — E1169 Type 2 diabetes mellitus with other specified complication: Secondary | ICD-10-CM | POA: Diagnosis not present

## 2020-11-21 DIAGNOSIS — G4733 Obstructive sleep apnea (adult) (pediatric): Secondary | ICD-10-CM | POA: Diagnosis not present

## 2020-11-22 DIAGNOSIS — H52203 Unspecified astigmatism, bilateral: Secondary | ICD-10-CM | POA: Diagnosis not present

## 2020-11-22 DIAGNOSIS — H40013 Open angle with borderline findings, low risk, bilateral: Secondary | ICD-10-CM | POA: Diagnosis not present

## 2020-11-22 DIAGNOSIS — E119 Type 2 diabetes mellitus without complications: Secondary | ICD-10-CM | POA: Diagnosis not present

## 2020-11-22 DIAGNOSIS — H2513 Age-related nuclear cataract, bilateral: Secondary | ICD-10-CM | POA: Diagnosis not present

## 2020-12-14 DIAGNOSIS — E1169 Type 2 diabetes mellitus with other specified complication: Secondary | ICD-10-CM | POA: Diagnosis not present

## 2020-12-31 DIAGNOSIS — E1169 Type 2 diabetes mellitus with other specified complication: Secondary | ICD-10-CM | POA: Diagnosis not present

## 2020-12-31 DIAGNOSIS — N183 Chronic kidney disease, stage 3 unspecified: Secondary | ICD-10-CM | POA: Diagnosis not present

## 2020-12-31 DIAGNOSIS — M1990 Unspecified osteoarthritis, unspecified site: Secondary | ICD-10-CM | POA: Diagnosis not present

## 2020-12-31 DIAGNOSIS — E7800 Pure hypercholesterolemia, unspecified: Secondary | ICD-10-CM | POA: Diagnosis not present

## 2021-01-15 DIAGNOSIS — E1169 Type 2 diabetes mellitus with other specified complication: Secondary | ICD-10-CM | POA: Diagnosis not present

## 2021-02-13 DIAGNOSIS — Z23 Encounter for immunization: Secondary | ICD-10-CM | POA: Diagnosis not present

## 2021-03-14 DIAGNOSIS — E7800 Pure hypercholesterolemia, unspecified: Secondary | ICD-10-CM | POA: Diagnosis not present

## 2021-03-14 DIAGNOSIS — E1169 Type 2 diabetes mellitus with other specified complication: Secondary | ICD-10-CM | POA: Diagnosis not present

## 2021-03-14 DIAGNOSIS — N183 Chronic kidney disease, stage 3 unspecified: Secondary | ICD-10-CM | POA: Diagnosis not present

## 2021-03-14 DIAGNOSIS — M1990 Unspecified osteoarthritis, unspecified site: Secondary | ICD-10-CM | POA: Diagnosis not present

## 2021-03-18 DIAGNOSIS — E1169 Type 2 diabetes mellitus with other specified complication: Secondary | ICD-10-CM | POA: Diagnosis not present

## 2021-04-16 DIAGNOSIS — E1169 Type 2 diabetes mellitus with other specified complication: Secondary | ICD-10-CM | POA: Diagnosis not present

## 2021-04-17 DIAGNOSIS — E1169 Type 2 diabetes mellitus with other specified complication: Secondary | ICD-10-CM | POA: Diagnosis not present

## 2021-04-23 DIAGNOSIS — E1169 Type 2 diabetes mellitus with other specified complication: Secondary | ICD-10-CM | POA: Diagnosis not present

## 2021-04-23 DIAGNOSIS — E7800 Pure hypercholesterolemia, unspecified: Secondary | ICD-10-CM | POA: Diagnosis not present

## 2021-04-23 DIAGNOSIS — R600 Localized edema: Secondary | ICD-10-CM | POA: Diagnosis not present

## 2021-04-23 DIAGNOSIS — N183 Chronic kidney disease, stage 3 unspecified: Secondary | ICD-10-CM | POA: Diagnosis not present

## 2021-04-23 DIAGNOSIS — E559 Vitamin D deficiency, unspecified: Secondary | ICD-10-CM | POA: Diagnosis not present

## 2021-05-17 DIAGNOSIS — E1169 Type 2 diabetes mellitus with other specified complication: Secondary | ICD-10-CM | POA: Diagnosis not present

## 2021-05-29 DIAGNOSIS — H40013 Open angle with borderline findings, low risk, bilateral: Secondary | ICD-10-CM | POA: Diagnosis not present

## 2021-06-13 ENCOUNTER — Other Ambulatory Visit: Payer: Self-pay | Admitting: Family Medicine

## 2021-06-13 DIAGNOSIS — E7800 Pure hypercholesterolemia, unspecified: Secondary | ICD-10-CM | POA: Diagnosis not present

## 2021-06-13 DIAGNOSIS — M1990 Unspecified osteoarthritis, unspecified site: Secondary | ICD-10-CM | POA: Diagnosis not present

## 2021-06-13 DIAGNOSIS — E1169 Type 2 diabetes mellitus with other specified complication: Secondary | ICD-10-CM | POA: Diagnosis not present

## 2021-06-13 DIAGNOSIS — Z1231 Encounter for screening mammogram for malignant neoplasm of breast: Secondary | ICD-10-CM

## 2021-06-13 DIAGNOSIS — N183 Chronic kidney disease, stage 3 unspecified: Secondary | ICD-10-CM | POA: Diagnosis not present

## 2021-06-17 DIAGNOSIS — E1169 Type 2 diabetes mellitus with other specified complication: Secondary | ICD-10-CM | POA: Diagnosis not present

## 2021-06-18 ENCOUNTER — Ambulatory Visit
Admission: RE | Admit: 2021-06-18 | Discharge: 2021-06-18 | Disposition: A | Discharge status: Home / Self Care | Payer: Medicare Other | Source: Ambulatory Visit | Attending: Family Medicine | Admitting: Family Medicine

## 2021-06-18 DIAGNOSIS — Z1231 Encounter for screening mammogram for malignant neoplasm of breast: Secondary | ICD-10-CM

## 2021-07-05 DIAGNOSIS — E7800 Pure hypercholesterolemia, unspecified: Secondary | ICD-10-CM | POA: Diagnosis not present

## 2021-07-05 DIAGNOSIS — E1169 Type 2 diabetes mellitus with other specified complication: Secondary | ICD-10-CM | POA: Diagnosis not present

## 2021-07-05 DIAGNOSIS — N183 Chronic kidney disease, stage 3 unspecified: Secondary | ICD-10-CM | POA: Diagnosis not present

## 2021-07-16 DIAGNOSIS — E1169 Type 2 diabetes mellitus with other specified complication: Secondary | ICD-10-CM | POA: Diagnosis not present

## 2021-08-15 DIAGNOSIS — E1169 Type 2 diabetes mellitus with other specified complication: Secondary | ICD-10-CM | POA: Diagnosis not present

## 2021-08-19 DIAGNOSIS — E1169 Type 2 diabetes mellitus with other specified complication: Secondary | ICD-10-CM | POA: Diagnosis not present

## 2021-08-19 DIAGNOSIS — E7800 Pure hypercholesterolemia, unspecified: Secondary | ICD-10-CM | POA: Diagnosis not present

## 2021-08-19 DIAGNOSIS — N183 Chronic kidney disease, stage 3 unspecified: Secondary | ICD-10-CM | POA: Diagnosis not present

## 2021-10-23 DIAGNOSIS — E1169 Type 2 diabetes mellitus with other specified complication: Secondary | ICD-10-CM | POA: Diagnosis not present

## 2021-10-23 DIAGNOSIS — E7800 Pure hypercholesterolemia, unspecified: Secondary | ICD-10-CM | POA: Diagnosis not present

## 2021-10-23 DIAGNOSIS — I10 Essential (primary) hypertension: Secondary | ICD-10-CM | POA: Diagnosis not present

## 2021-10-23 DIAGNOSIS — N183 Chronic kidney disease, stage 3 unspecified: Secondary | ICD-10-CM | POA: Diagnosis not present

## 2021-10-23 DIAGNOSIS — Z0000 Encounter for general adult medical examination without abnormal findings: Secondary | ICD-10-CM | POA: Diagnosis not present

## 2021-10-28 DIAGNOSIS — M1712 Unilateral primary osteoarthritis, left knee: Secondary | ICD-10-CM | POA: Diagnosis not present

## 2021-11-26 DIAGNOSIS — H0011 Chalazion right upper eyelid: Secondary | ICD-10-CM | POA: Diagnosis not present

## 2021-11-26 DIAGNOSIS — E119 Type 2 diabetes mellitus without complications: Secondary | ICD-10-CM | POA: Diagnosis not present

## 2021-11-26 DIAGNOSIS — H2513 Age-related nuclear cataract, bilateral: Secondary | ICD-10-CM | POA: Diagnosis not present

## 2021-11-26 DIAGNOSIS — H52203 Unspecified astigmatism, bilateral: Secondary | ICD-10-CM | POA: Diagnosis not present

## 2021-12-04 DIAGNOSIS — M1712 Unilateral primary osteoarthritis, left knee: Secondary | ICD-10-CM | POA: Diagnosis not present

## 2021-12-11 DIAGNOSIS — M1712 Unilateral primary osteoarthritis, left knee: Secondary | ICD-10-CM | POA: Diagnosis not present

## 2021-12-19 DIAGNOSIS — M1712 Unilateral primary osteoarthritis, left knee: Secondary | ICD-10-CM | POA: Diagnosis not present

## 2021-12-25 ENCOUNTER — Encounter (INDEPENDENT_AMBULATORY_CARE_PROVIDER_SITE_OTHER): Payer: Self-pay

## 2022-01-16 DIAGNOSIS — H269 Unspecified cataract: Secondary | ICD-10-CM | POA: Diagnosis not present

## 2022-01-16 DIAGNOSIS — H2511 Age-related nuclear cataract, right eye: Secondary | ICD-10-CM | POA: Diagnosis not present

## 2022-01-27 DIAGNOSIS — M25562 Pain in left knee: Secondary | ICD-10-CM | POA: Diagnosis not present

## 2022-04-17 DIAGNOSIS — H269 Unspecified cataract: Secondary | ICD-10-CM | POA: Diagnosis not present

## 2022-04-17 DIAGNOSIS — H2512 Age-related nuclear cataract, left eye: Secondary | ICD-10-CM | POA: Diagnosis not present

## 2022-05-26 DIAGNOSIS — I10 Essential (primary) hypertension: Secondary | ICD-10-CM | POA: Diagnosis not present

## 2022-05-26 DIAGNOSIS — E559 Vitamin D deficiency, unspecified: Secondary | ICD-10-CM | POA: Diagnosis not present

## 2022-05-26 DIAGNOSIS — N183 Chronic kidney disease, stage 3 unspecified: Secondary | ICD-10-CM | POA: Diagnosis not present

## 2022-05-26 DIAGNOSIS — Z1211 Encounter for screening for malignant neoplasm of colon: Secondary | ICD-10-CM | POA: Diagnosis not present

## 2022-05-26 DIAGNOSIS — M8588 Other specified disorders of bone density and structure, other site: Secondary | ICD-10-CM | POA: Diagnosis not present

## 2022-05-26 DIAGNOSIS — E1169 Type 2 diabetes mellitus with other specified complication: Secondary | ICD-10-CM | POA: Diagnosis not present

## 2022-05-26 DIAGNOSIS — Z1231 Encounter for screening mammogram for malignant neoplasm of breast: Secondary | ICD-10-CM | POA: Diagnosis not present

## 2022-05-26 DIAGNOSIS — E7800 Pure hypercholesterolemia, unspecified: Secondary | ICD-10-CM | POA: Diagnosis not present

## 2022-05-27 ENCOUNTER — Other Ambulatory Visit: Payer: Self-pay | Admitting: Family Medicine

## 2022-05-27 DIAGNOSIS — M8588 Other specified disorders of bone density and structure, other site: Secondary | ICD-10-CM

## 2022-05-27 DIAGNOSIS — Z1231 Encounter for screening mammogram for malignant neoplasm of breast: Secondary | ICD-10-CM

## 2022-05-30 DIAGNOSIS — Z961 Presence of intraocular lens: Secondary | ICD-10-CM | POA: Diagnosis not present

## 2022-07-16 DIAGNOSIS — R194 Change in bowel habit: Secondary | ICD-10-CM | POA: Diagnosis not present

## 2022-07-16 DIAGNOSIS — Z800 Family history of malignant neoplasm of digestive organs: Secondary | ICD-10-CM | POA: Diagnosis not present

## 2022-08-25 DIAGNOSIS — Z800 Family history of malignant neoplasm of digestive organs: Secondary | ICD-10-CM | POA: Diagnosis not present

## 2022-08-25 DIAGNOSIS — D125 Benign neoplasm of sigmoid colon: Secondary | ICD-10-CM | POA: Diagnosis not present

## 2022-08-25 DIAGNOSIS — K649 Unspecified hemorrhoids: Secondary | ICD-10-CM | POA: Diagnosis not present

## 2022-08-27 DIAGNOSIS — D125 Benign neoplasm of sigmoid colon: Secondary | ICD-10-CM | POA: Diagnosis not present

## 2022-11-10 ENCOUNTER — Ambulatory Visit
Admission: RE | Admit: 2022-11-10 | Discharge: 2022-11-10 | Disposition: A | Discharge status: Home / Self Care | Payer: Medicare Other | Source: Ambulatory Visit | Attending: Family Medicine | Admitting: Family Medicine

## 2022-11-10 DIAGNOSIS — E349 Endocrine disorder, unspecified: Secondary | ICD-10-CM | POA: Diagnosis not present

## 2022-11-10 DIAGNOSIS — Z1231 Encounter for screening mammogram for malignant neoplasm of breast: Secondary | ICD-10-CM | POA: Diagnosis not present

## 2022-11-10 DIAGNOSIS — M8588 Other specified disorders of bone density and structure, other site: Secondary | ICD-10-CM

## 2022-11-11 DIAGNOSIS — E7800 Pure hypercholesterolemia, unspecified: Secondary | ICD-10-CM | POA: Diagnosis not present

## 2022-11-11 DIAGNOSIS — I10 Essential (primary) hypertension: Secondary | ICD-10-CM | POA: Diagnosis not present

## 2022-11-11 DIAGNOSIS — N183 Chronic kidney disease, stage 3 unspecified: Secondary | ICD-10-CM | POA: Diagnosis not present

## 2022-11-11 DIAGNOSIS — E559 Vitamin D deficiency, unspecified: Secondary | ICD-10-CM | POA: Diagnosis not present

## 2022-11-11 DIAGNOSIS — M8588 Other specified disorders of bone density and structure, other site: Secondary | ICD-10-CM | POA: Diagnosis not present

## 2022-11-11 DIAGNOSIS — E1122 Type 2 diabetes mellitus with diabetic chronic kidney disease: Secondary | ICD-10-CM | POA: Diagnosis not present

## 2022-11-11 DIAGNOSIS — Z0000 Encounter for general adult medical examination without abnormal findings: Secondary | ICD-10-CM | POA: Diagnosis not present

## 2022-11-11 DIAGNOSIS — E1169 Type 2 diabetes mellitus with other specified complication: Secondary | ICD-10-CM | POA: Diagnosis not present

## 2022-11-30 IMAGING — MG MM DIGITAL SCREENING BILAT W/ TOMO AND CAD
6 of 10 series · 6 of 30 positions shown · non-contrast
Comparison: Previous exam(s).

CLINICAL DATA: Screening.

EXAM:
DIGITAL SCREENING BILATERAL MAMMOGRAM WITH TOMOSYNTHESIS AND CAD
TECHNIQUE: Bilateral screening digital craniocaudal and mediolateral oblique
mammograms were obtained. Bilateral screening digital breast
tomosynthesis was performed. The images were evaluated with
computer-aided detection.

[L CC synth-2D]
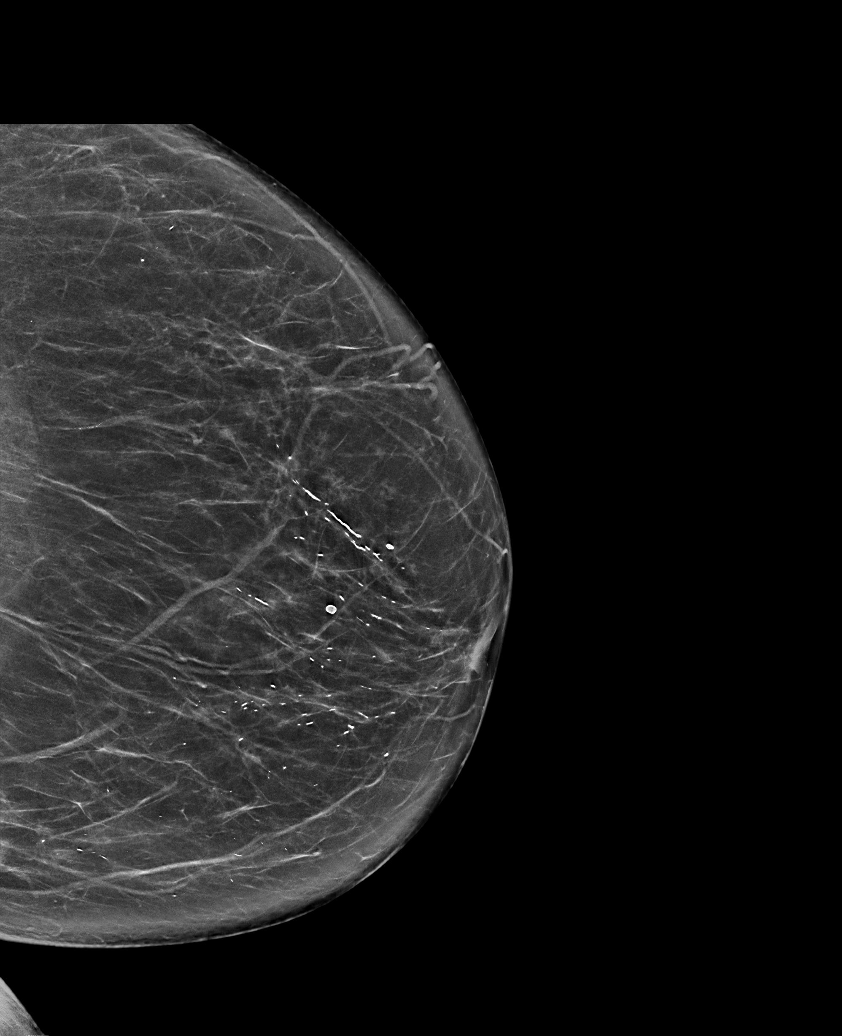

[R MLO synth-2D]
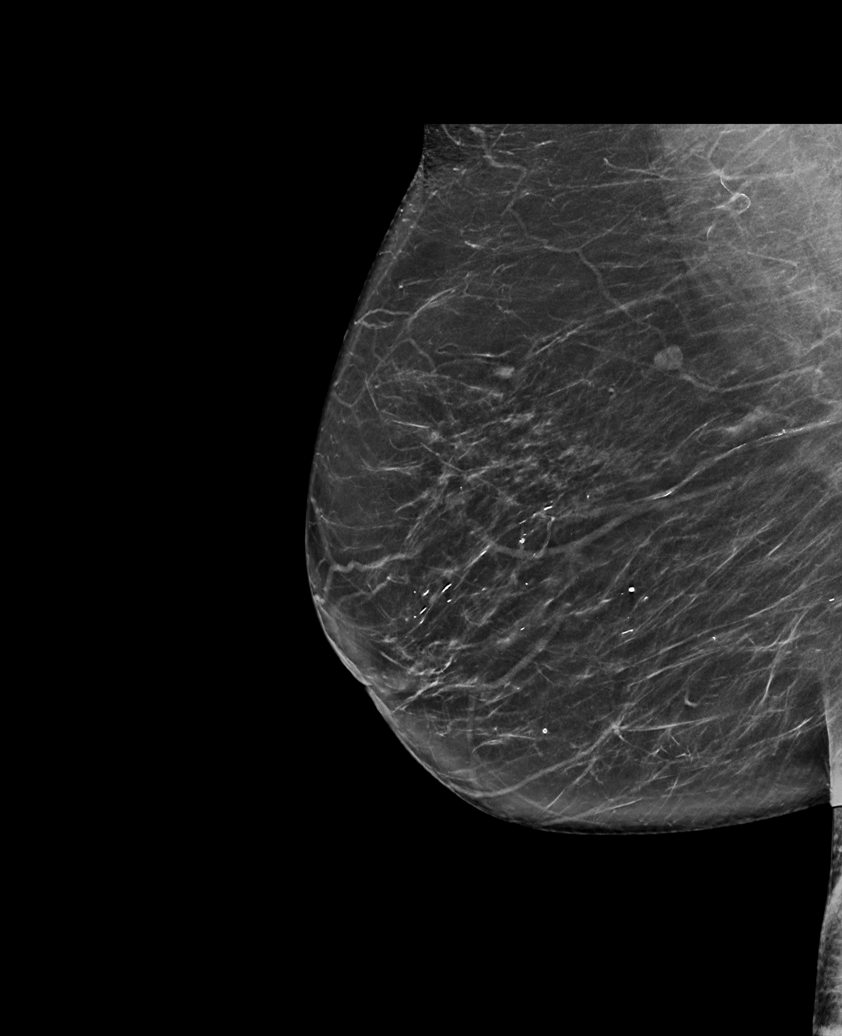

[R CC synth-2D]
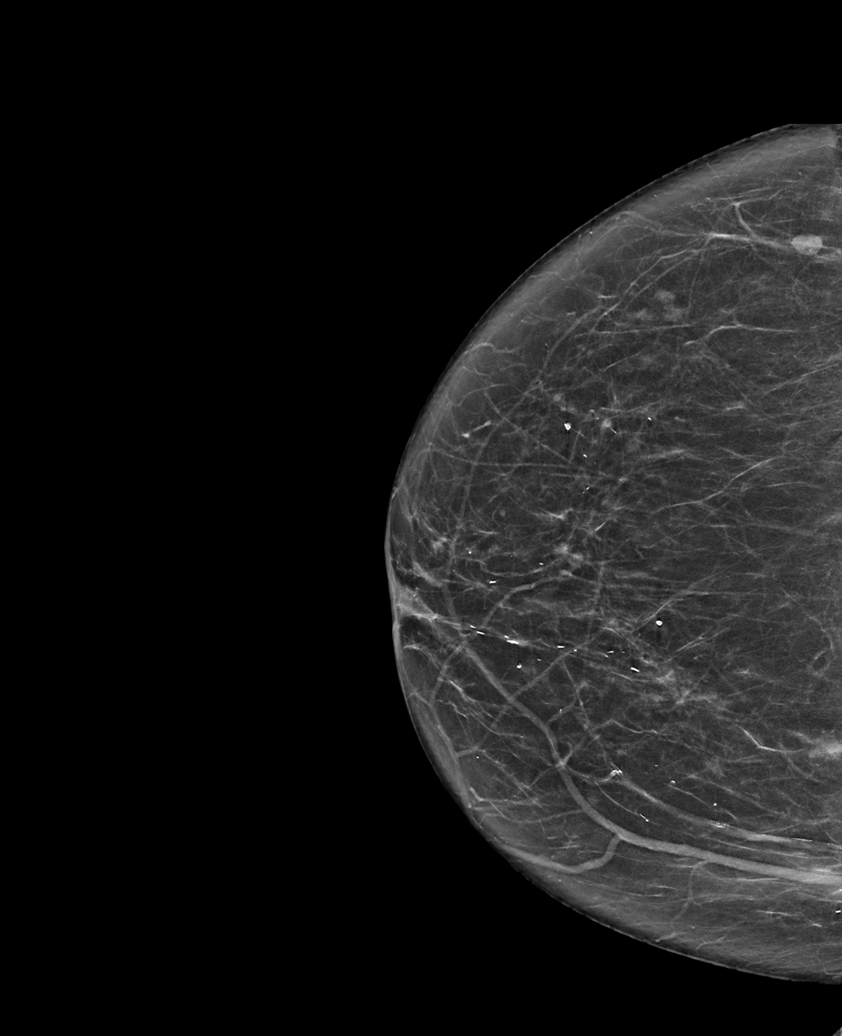

[L MLO synth-2D (1 of 2)]
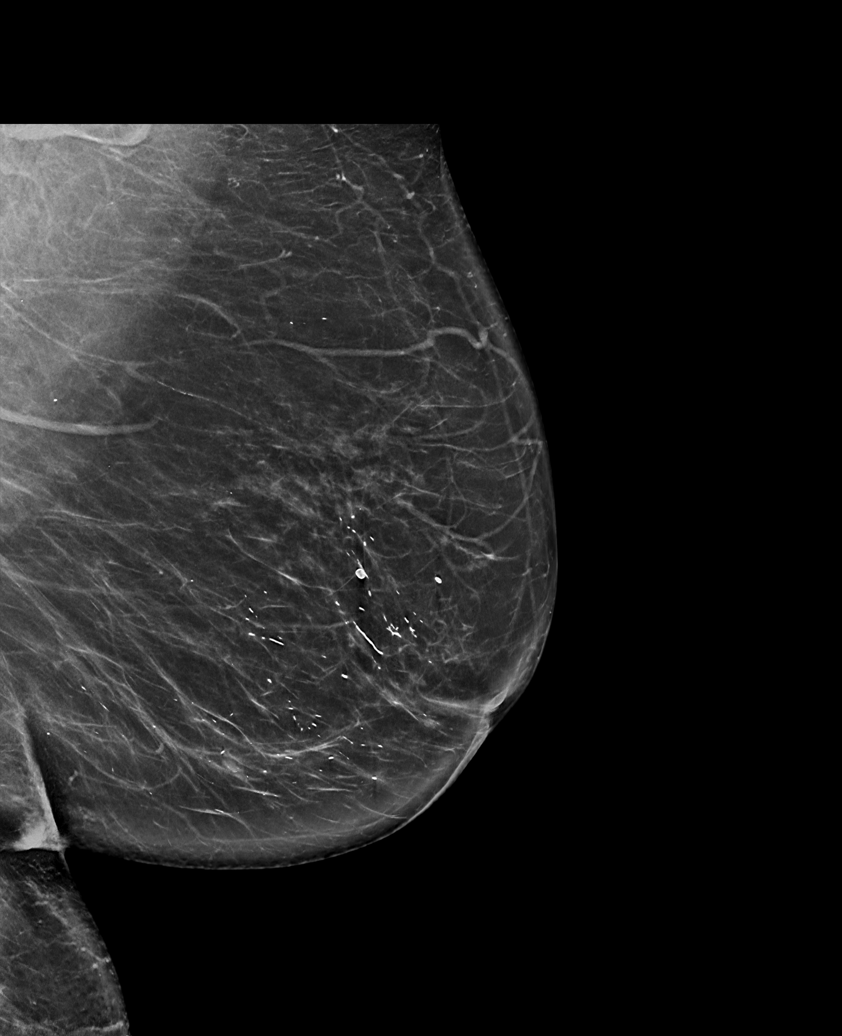

[L MLO synth-2D (2 of 2)]
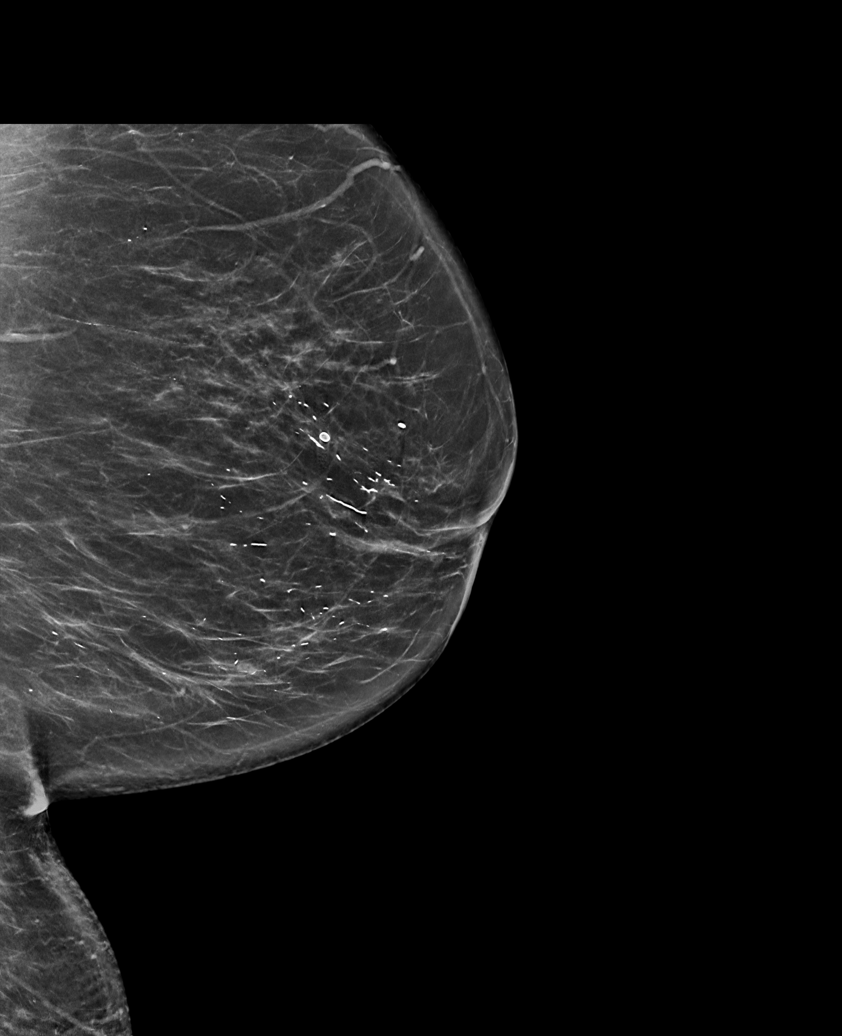

[L MLO tomo · tomo slice 39/76.0]
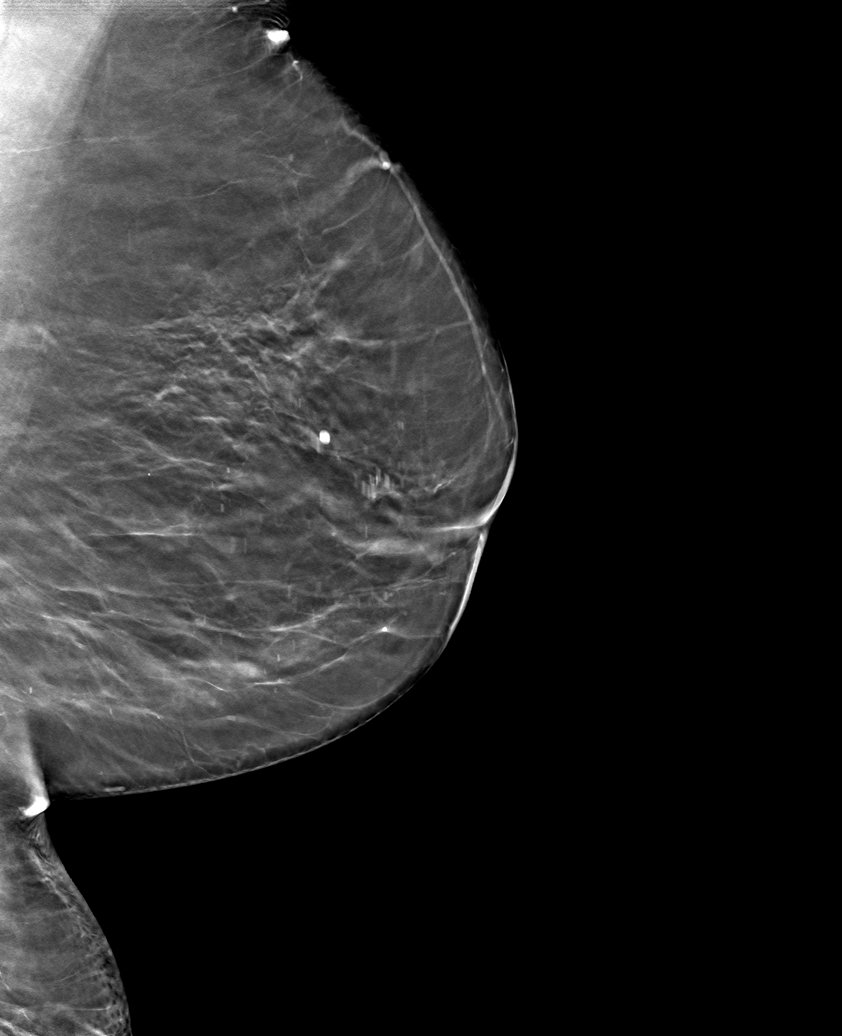

[6 of 30 positions shown; findings below may reference images not displayed]

ACR Breast Density Category c: The breast tissue is heterogeneously
dense, which may obscure small masses.
FINDINGS: There are no findings suspicious for malignancy.
IMPRESSION: No mammographic evidence of malignancy. A result letter of this
screening mammogram will be mailed directly to the patient.

RECOMMENDATION:
Screening mammogram in one year. (Code:Q3-W-BC3)

BI-RADS CATEGORY  1: Negative.

## 2023-06-03 DIAGNOSIS — I10 Essential (primary) hypertension: Secondary | ICD-10-CM | POA: Diagnosis not present

## 2023-06-03 DIAGNOSIS — E7800 Pure hypercholesterolemia, unspecified: Secondary | ICD-10-CM | POA: Diagnosis not present

## 2023-06-03 DIAGNOSIS — E119 Type 2 diabetes mellitus without complications: Secondary | ICD-10-CM | POA: Diagnosis not present

## 2023-06-03 DIAGNOSIS — N183 Chronic kidney disease, stage 3 unspecified: Secondary | ICD-10-CM | POA: Diagnosis not present

## 2023-06-03 DIAGNOSIS — E559 Vitamin D deficiency, unspecified: Secondary | ICD-10-CM | POA: Diagnosis not present

## 2023-06-11 DIAGNOSIS — H52203 Unspecified astigmatism, bilateral: Secondary | ICD-10-CM | POA: Diagnosis not present

## 2023-06-11 DIAGNOSIS — H26493 Other secondary cataract, bilateral: Secondary | ICD-10-CM | POA: Diagnosis not present

## 2023-06-11 DIAGNOSIS — E119 Type 2 diabetes mellitus without complications: Secondary | ICD-10-CM | POA: Diagnosis not present

## 2023-10-17 DIAGNOSIS — E7800 Pure hypercholesterolemia, unspecified: Secondary | ICD-10-CM | POA: Diagnosis not present

## 2023-10-17 DIAGNOSIS — E119 Type 2 diabetes mellitus without complications: Secondary | ICD-10-CM | POA: Diagnosis not present

## 2023-10-17 DIAGNOSIS — N183 Chronic kidney disease, stage 3 unspecified: Secondary | ICD-10-CM | POA: Diagnosis not present

## 2023-10-17 DIAGNOSIS — I10 Essential (primary) hypertension: Secondary | ICD-10-CM | POA: Diagnosis not present

## 2023-11-16 DIAGNOSIS — I10 Essential (primary) hypertension: Secondary | ICD-10-CM | POA: Diagnosis not present

## 2023-11-16 DIAGNOSIS — N183 Chronic kidney disease, stage 3 unspecified: Secondary | ICD-10-CM | POA: Diagnosis not present

## 2023-11-16 DIAGNOSIS — E7800 Pure hypercholesterolemia, unspecified: Secondary | ICD-10-CM | POA: Diagnosis not present

## 2023-11-16 DIAGNOSIS — E119 Type 2 diabetes mellitus without complications: Secondary | ICD-10-CM | POA: Diagnosis not present

## 2023-11-26 DIAGNOSIS — N183 Chronic kidney disease, stage 3 unspecified: Secondary | ICD-10-CM | POA: Diagnosis not present

## 2023-11-26 DIAGNOSIS — I10 Essential (primary) hypertension: Secondary | ICD-10-CM | POA: Diagnosis not present

## 2023-11-26 DIAGNOSIS — Z0000 Encounter for general adult medical examination without abnormal findings: Secondary | ICD-10-CM | POA: Diagnosis not present

## 2023-11-26 DIAGNOSIS — E1169 Type 2 diabetes mellitus with other specified complication: Secondary | ICD-10-CM | POA: Diagnosis not present

## 2023-11-26 DIAGNOSIS — E119 Type 2 diabetes mellitus without complications: Secondary | ICD-10-CM | POA: Diagnosis not present

## 2023-11-26 DIAGNOSIS — Z1283 Encounter for screening for malignant neoplasm of skin: Secondary | ICD-10-CM | POA: Diagnosis not present

## 2023-11-26 DIAGNOSIS — E559 Vitamin D deficiency, unspecified: Secondary | ICD-10-CM | POA: Diagnosis not present

## 2023-11-26 DIAGNOSIS — E7800 Pure hypercholesterolemia, unspecified: Secondary | ICD-10-CM | POA: Diagnosis not present

## 2023-11-27 ENCOUNTER — Other Ambulatory Visit: Payer: Self-pay | Admitting: Family Medicine

## 2023-11-27 DIAGNOSIS — Z1231 Encounter for screening mammogram for malignant neoplasm of breast: Secondary | ICD-10-CM

## 2023-12-15 DIAGNOSIS — D225 Melanocytic nevi of trunk: Secondary | ICD-10-CM | POA: Diagnosis not present

## 2023-12-15 DIAGNOSIS — D485 Neoplasm of uncertain behavior of skin: Secondary | ICD-10-CM | POA: Diagnosis not present

## 2023-12-15 DIAGNOSIS — B078 Other viral warts: Secondary | ICD-10-CM | POA: Diagnosis not present

## 2023-12-15 DIAGNOSIS — Z1283 Encounter for screening for malignant neoplasm of skin: Secondary | ICD-10-CM | POA: Diagnosis not present

## 2023-12-17 DIAGNOSIS — I10 Essential (primary) hypertension: Secondary | ICD-10-CM | POA: Diagnosis not present

## 2023-12-17 DIAGNOSIS — E119 Type 2 diabetes mellitus without complications: Secondary | ICD-10-CM | POA: Diagnosis not present

## 2023-12-17 DIAGNOSIS — N183 Chronic kidney disease, stage 3 unspecified: Secondary | ICD-10-CM | POA: Diagnosis not present

## 2023-12-17 DIAGNOSIS — E7800 Pure hypercholesterolemia, unspecified: Secondary | ICD-10-CM | POA: Diagnosis not present

## 2023-12-22 ENCOUNTER — Ambulatory Visit
Admission: RE | Admit: 2023-12-22 | Discharge: 2023-12-22 | Disposition: A | Discharge status: Home / Self Care | Source: Ambulatory Visit | Attending: Family Medicine | Admitting: Family Medicine

## 2023-12-22 DIAGNOSIS — Z1231 Encounter for screening mammogram for malignant neoplasm of breast: Secondary | ICD-10-CM

## 2024-01-17 DIAGNOSIS — E7800 Pure hypercholesterolemia, unspecified: Secondary | ICD-10-CM | POA: Diagnosis not present

## 2024-01-17 DIAGNOSIS — E119 Type 2 diabetes mellitus without complications: Secondary | ICD-10-CM | POA: Diagnosis not present

## 2024-01-17 DIAGNOSIS — N183 Chronic kidney disease, stage 3 unspecified: Secondary | ICD-10-CM | POA: Diagnosis not present

## 2024-01-17 DIAGNOSIS — I10 Essential (primary) hypertension: Secondary | ICD-10-CM | POA: Diagnosis not present

## 2024-02-16 DIAGNOSIS — N183 Chronic kidney disease, stage 3 unspecified: Secondary | ICD-10-CM | POA: Diagnosis not present

## 2024-02-16 DIAGNOSIS — E119 Type 2 diabetes mellitus without complications: Secondary | ICD-10-CM | POA: Diagnosis not present

## 2024-02-16 DIAGNOSIS — E7800 Pure hypercholesterolemia, unspecified: Secondary | ICD-10-CM | POA: Diagnosis not present

## 2024-02-16 DIAGNOSIS — I10 Essential (primary) hypertension: Secondary | ICD-10-CM | POA: Diagnosis not present
